# Patient Record
Sex: Female | Born: 1955 | Race: Asian | Hispanic: No | Marital: Married | Smoking: Never smoker
Health system: Southern US, Community
[De-identification: ages and names within clinical notes are randomized; demographics above are authoritative.]

## PROBLEM LIST (undated history)

## (undated) DIAGNOSIS — I10 Essential (primary) hypertension: Secondary | ICD-10-CM

## (undated) DIAGNOSIS — E785 Hyperlipidemia, unspecified: Secondary | ICD-10-CM

## (undated) DIAGNOSIS — K649 Unspecified hemorrhoids: Secondary | ICD-10-CM

## (undated) HISTORY — DX: Hyperlipidemia, unspecified: E78.5

## (undated) HISTORY — DX: Essential (primary) hypertension: I10

## (undated) HISTORY — DX: Unspecified hemorrhoids: K64.9

## (undated) HISTORY — PX: NO PAST SURGERIES: SHX2092

---

## 1998-05-08 ENCOUNTER — Encounter: Admission: RE | Admit: 1998-05-08 | Discharge: 1998-05-08 | Payer: Self-pay | Admitting: Internal Medicine

## 1998-05-20 ENCOUNTER — Other Ambulatory Visit: Admission: RE | Admit: 1998-05-20 | Discharge: 1998-05-20 | Payer: Self-pay | Admitting: *Deleted

## 1998-05-20 ENCOUNTER — Encounter: Admission: RE | Admit: 1998-05-20 | Discharge: 1998-05-20 | Payer: Self-pay | Admitting: Hematology and Oncology

## 1998-06-20 ENCOUNTER — Encounter: Admission: RE | Admit: 1998-06-20 | Discharge: 1998-06-20 | Payer: Self-pay | Admitting: Internal Medicine

## 1998-09-23 ENCOUNTER — Ambulatory Visit (HOSPITAL_COMMUNITY): Admission: RE | Admit: 1998-09-23 | Discharge: 1998-09-23 | Payer: Self-pay | Admitting: Internal Medicine

## 2001-03-15 ENCOUNTER — Encounter: Admission: RE | Admit: 2001-03-15 | Discharge: 2001-03-15 | Payer: Self-pay | Admitting: Internal Medicine

## 2001-03-15 ENCOUNTER — Ambulatory Visit (HOSPITAL_COMMUNITY): Admission: RE | Admit: 2001-03-15 | Discharge: 2001-03-15 | Payer: Self-pay | Admitting: Internal Medicine

## 2001-04-01 ENCOUNTER — Encounter: Admission: RE | Admit: 2001-04-01 | Discharge: 2001-04-01 | Payer: Self-pay | Admitting: Internal Medicine

## 2001-04-20 ENCOUNTER — Ambulatory Visit (HOSPITAL_COMMUNITY): Admission: RE | Admit: 2001-04-20 | Discharge: 2001-04-20 | Payer: Self-pay | Admitting: Internal Medicine

## 2001-06-27 ENCOUNTER — Encounter: Admission: RE | Admit: 2001-06-27 | Discharge: 2001-06-27 | Payer: Self-pay | Admitting: Internal Medicine

## 2002-04-21 ENCOUNTER — Ambulatory Visit (HOSPITAL_COMMUNITY): Admission: RE | Admit: 2002-04-21 | Discharge: 2002-04-21 | Payer: Self-pay | Admitting: Internal Medicine

## 2002-04-21 ENCOUNTER — Encounter: Payer: Self-pay | Admitting: Internal Medicine

## 2004-12-29 ENCOUNTER — Inpatient Hospital Stay (HOSPITAL_COMMUNITY): Admission: EM | Admit: 2004-12-29 | Discharge: 2004-12-30 | Payer: Self-pay | Admitting: Emergency Medicine

## 2004-12-29 ENCOUNTER — Ambulatory Visit: Payer: Self-pay | Admitting: Internal Medicine

## 2005-01-29 ENCOUNTER — Ambulatory Visit: Payer: Self-pay | Admitting: Internal Medicine

## 2005-04-24 ENCOUNTER — Ambulatory Visit: Payer: Self-pay | Admitting: Internal Medicine

## 2005-09-04 ENCOUNTER — Ambulatory Visit: Payer: Self-pay | Admitting: Internal Medicine

## 2005-09-08 ENCOUNTER — Ambulatory Visit (HOSPITAL_COMMUNITY): Admission: RE | Admit: 2005-09-08 | Discharge: 2005-09-08 | Payer: Self-pay | Admitting: Internal Medicine

## 2005-09-11 ENCOUNTER — Ambulatory Visit: Payer: Self-pay | Admitting: Hospitalist

## 2005-10-08 ENCOUNTER — Ambulatory Visit: Payer: Self-pay | Admitting: Internal Medicine

## 2006-07-28 ENCOUNTER — Ambulatory Visit: Payer: Self-pay | Admitting: Internal Medicine

## 2006-07-28 ENCOUNTER — Encounter (INDEPENDENT_AMBULATORY_CARE_PROVIDER_SITE_OTHER): Payer: Self-pay | Admitting: Internal Medicine

## 2006-07-28 LAB — CONVERTED CEMR LAB
Bilirubin Urine: NEGATIVE
Hemoglobin, Urine: NEGATIVE
Ketones, ur: NEGATIVE mg/dL
Protein, ur: NEGATIVE mg/dL
Specific Gravity, Urine: 1.026 (ref 1.005–1.03)
Urine Glucose: NEGATIVE mg/dL
pH: 7.5 (ref 5.0–8.0)

## 2006-07-29 ENCOUNTER — Encounter (INDEPENDENT_AMBULATORY_CARE_PROVIDER_SITE_OTHER): Payer: Self-pay | Admitting: Internal Medicine

## 2006-08-04 ENCOUNTER — Ambulatory Visit: Payer: Self-pay | Admitting: Internal Medicine

## 2006-08-19 DIAGNOSIS — N390 Urinary tract infection, site not specified: Secondary | ICD-10-CM

## 2006-08-19 DIAGNOSIS — K219 Gastro-esophageal reflux disease without esophagitis: Secondary | ICD-10-CM

## 2006-08-19 DIAGNOSIS — K649 Unspecified hemorrhoids: Secondary | ICD-10-CM | POA: Insufficient documentation

## 2006-08-20 ENCOUNTER — Ambulatory Visit: Payer: Self-pay | Admitting: Internal Medicine

## 2006-08-20 ENCOUNTER — Encounter (INDEPENDENT_AMBULATORY_CARE_PROVIDER_SITE_OTHER): Payer: Self-pay | Admitting: Internal Medicine

## 2006-08-20 LAB — CONVERTED CEMR LAB
AST: 18 units/L (ref 0–37)
Albumin: 4.3 g/dL (ref 3.5–5.2)
Alkaline Phosphatase: 81 units/L (ref 39–117)
BUN: 20 mg/dL (ref 6–23)
Basophils Absolute: 0 10*3/uL (ref 0.0–0.1)
Basophils Relative: 1 % (ref 0–1)
CO2: 25 meq/L (ref 19–32)
Calcium: 9.2 mg/dL (ref 8.4–10.5)
Creatinine, Ser: 0.74 mg/dL (ref 0.40–1.20)
GC Probe Amp, Genital: NEGATIVE
Glucose, Bld: 86 mg/dL (ref 70–99)
HCT: 37.5 % (ref 34.4–43.3)
Hemoglobin, Urine: NEGATIVE
Hemoglobin: 12.3 g/dL (ref 11.7–14.8)
Leukocytes, UA: NEGATIVE
Lymphocytes Relative: 43 % (ref 15–43)
MCHC: 32.8 g/dL — ABNORMAL LOW (ref 33.1–35.4)
MCV: 79.8 fL (ref 78.8–100.0)
Neutrophils Relative %: 49 % (ref 47–77)
Pap Smear: NORMAL
Platelets: 257 10*3/uL (ref 152–374)
Potassium: 4.2 meq/L (ref 3.5–5.3)
Specific Gravity, Urine: 1.025 (ref 1.005–1.03)
Urine Glucose: NEGATIVE mg/dL
Urobilinogen, UA: 1 (ref 0.0–1.0)
pH: 6 (ref 5.0–8.0)

## 2006-08-24 ENCOUNTER — Ambulatory Visit (HOSPITAL_COMMUNITY): Admission: RE | Admit: 2006-08-24 | Discharge: 2006-08-24 | Payer: Self-pay | Admitting: Internal Medicine

## 2006-09-10 ENCOUNTER — Encounter (INDEPENDENT_AMBULATORY_CARE_PROVIDER_SITE_OTHER): Payer: Self-pay | Admitting: Internal Medicine

## 2006-09-10 DIAGNOSIS — M545 Low back pain: Secondary | ICD-10-CM

## 2007-05-04 IMAGING — US US TRANSVAGINAL NON-OB
1 series · 14 of 25 positions shown · non-contrast
Comparison: none

CLINICAL DATA: Right pelvic pain.  
 TRANSABDOMINAL AND TRANSVAGINAL PELVIC ULTRASOUND:
TECHNIQUE: Both transabdominal and transvaginal ultrasound examinations of the pelvis were performed including evaluation of the uterus, ovaries, adnexal regions, and pelvic cul-de-sac.

[Series 1: unknown · 0.28mm/px · 14 of 62 slices shown]
[im 1/62]
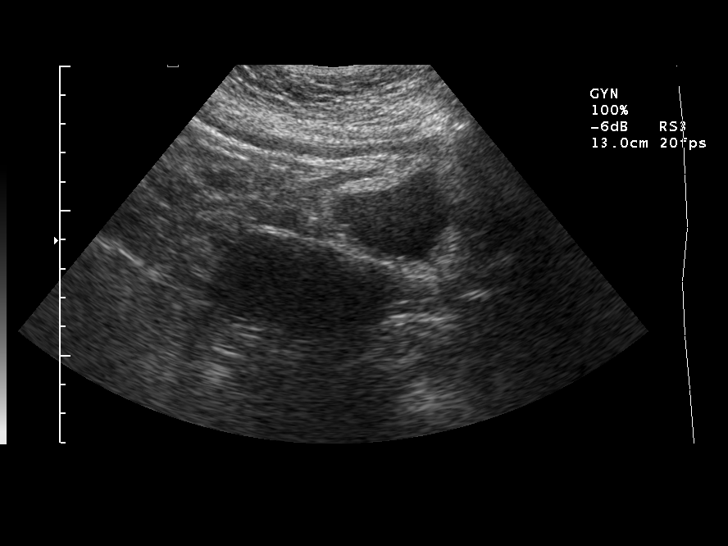
[im 6/62]
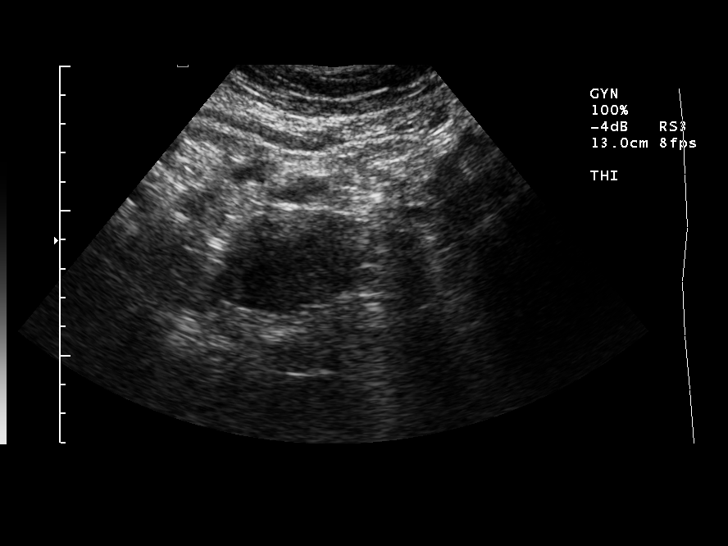
[im 11/62]
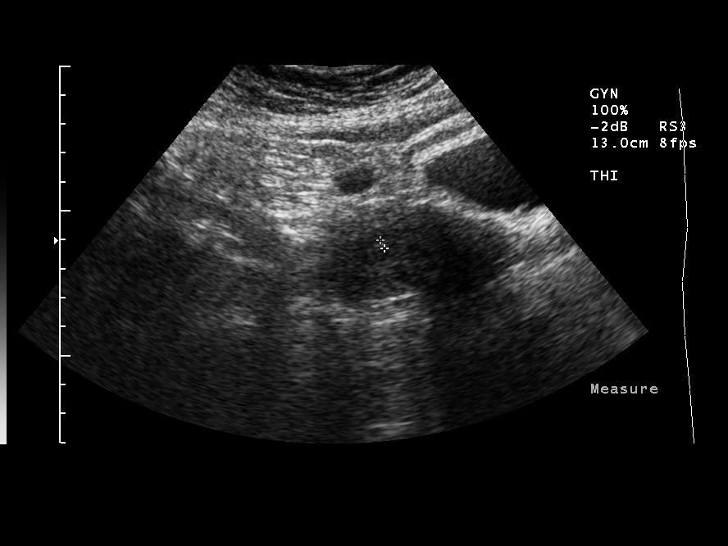
[im 16/62]
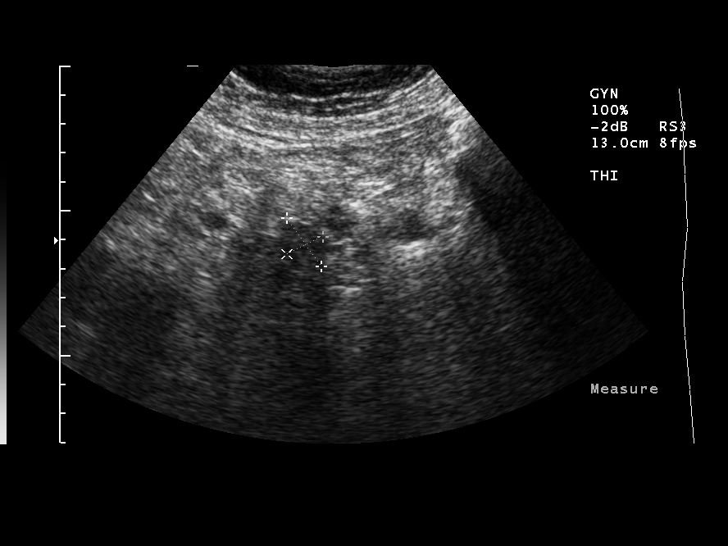
[im 21/62]
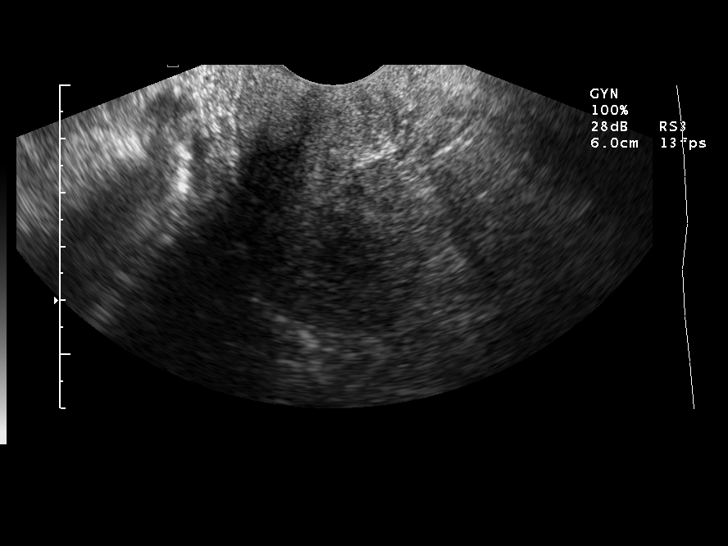
[im 23/62]
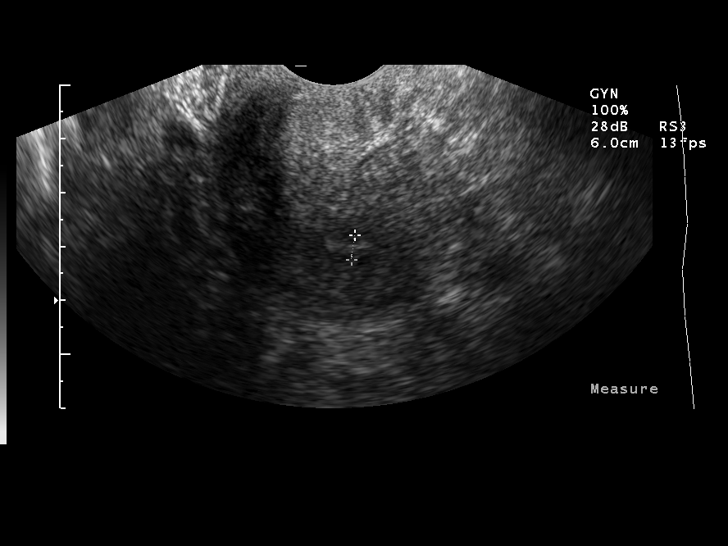
[im 28/62]
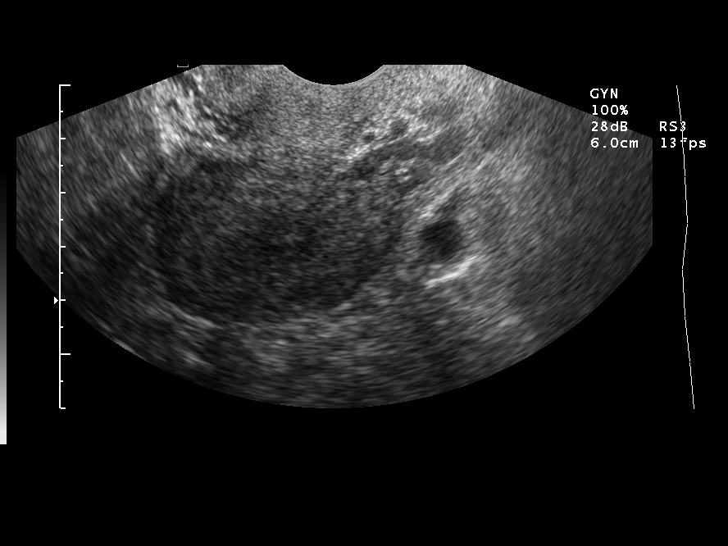
[im 34/62]
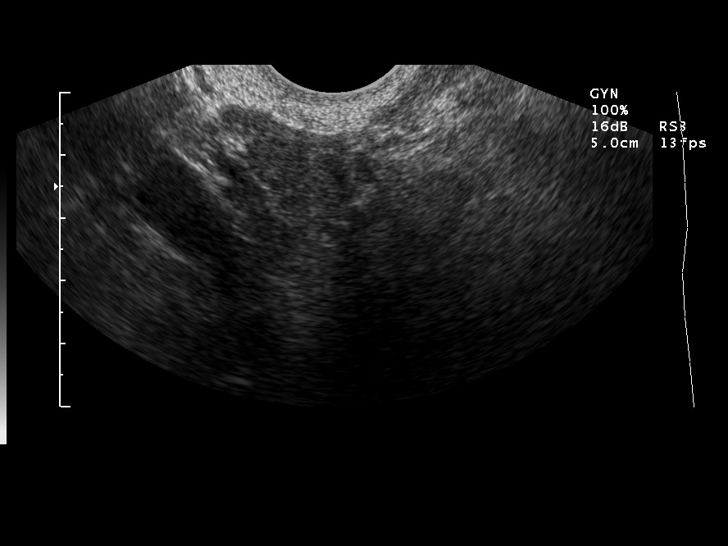
[im 39/62]
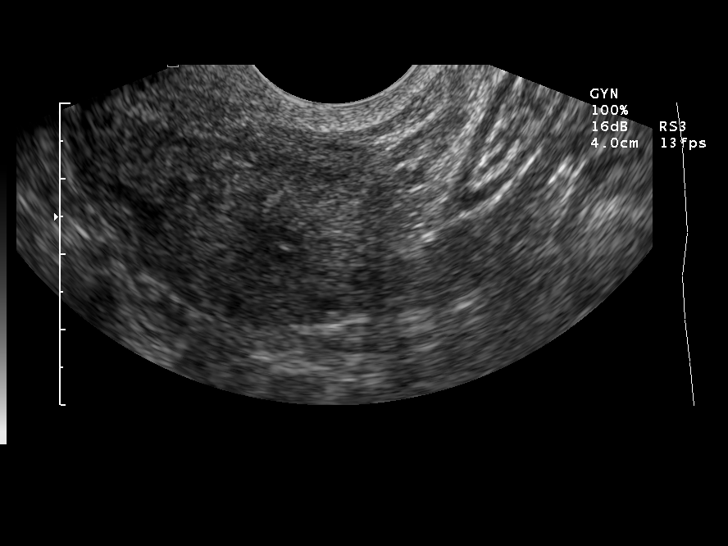
[im 41/62]
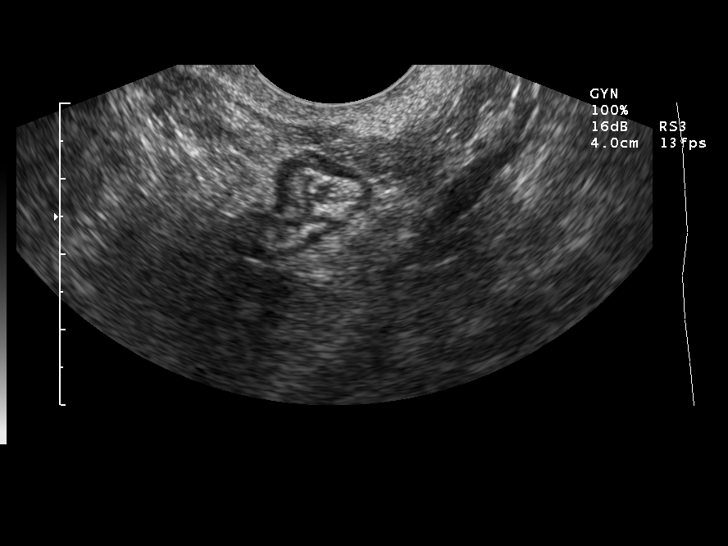
[im 46/62]
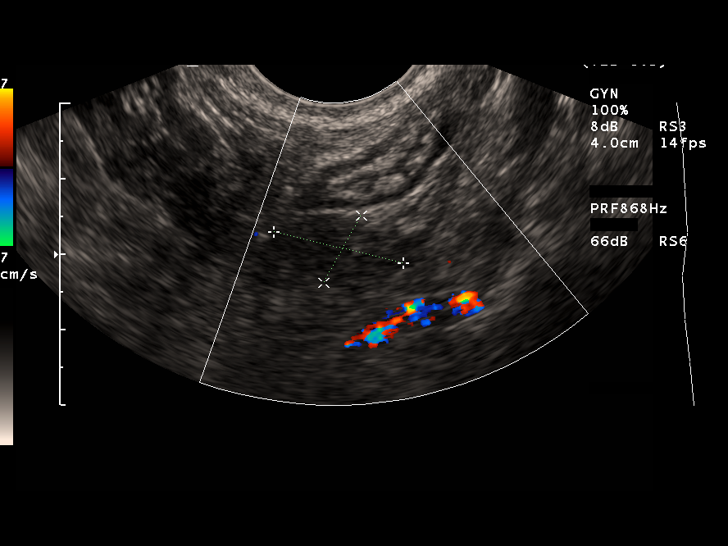
[im 51/62]
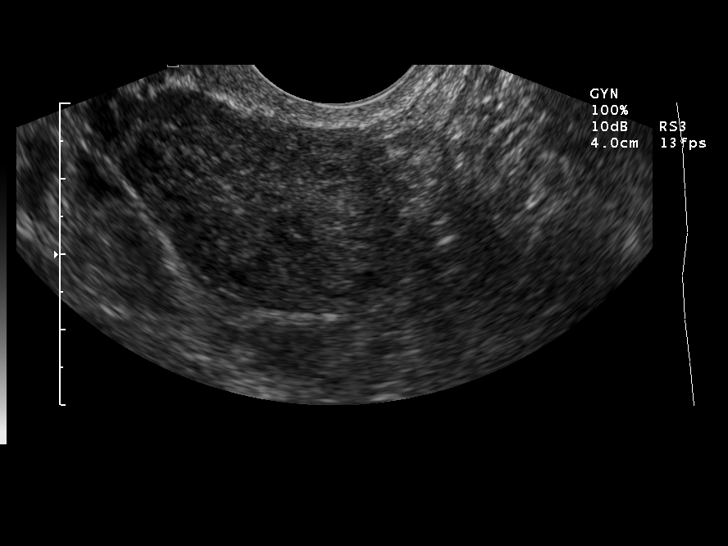
[im 56/62]
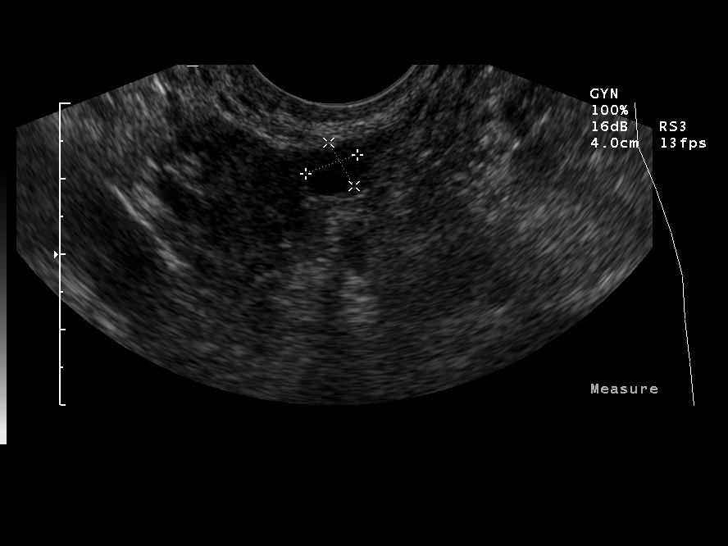
[im 62/62]
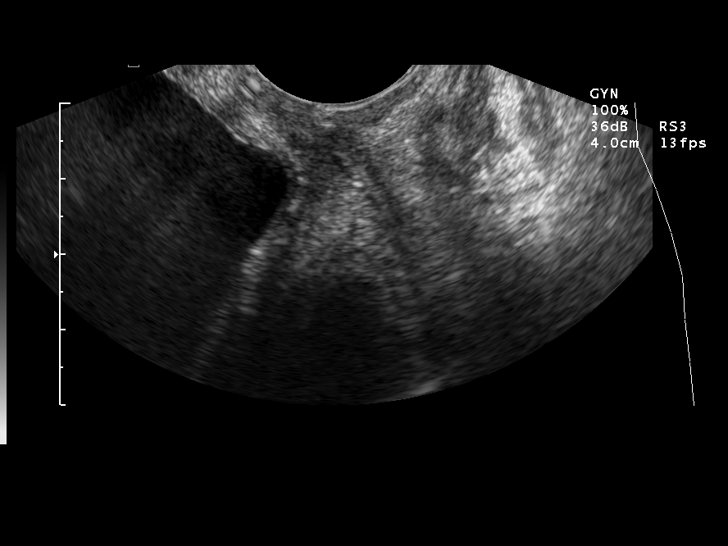

[14 of 25 positions shown; findings below may reference images not displayed]

FINDINGS: The uterus is retroverted but is normal in size measuring approximately 6 cm in length.  No fibroids or other uterine masses are seen.  Endometrium is normal in appearance and measures 2 to 3 mm in thickness.  
 Both ovaries are normal in appearance.  No adnexal masses are identified by either transabdominal or transvaginal sonography and there is no evidence of free fluid.
IMPRESSION: 1.  Normal retroverted uterus. 
 2.  Normal ovaries.  No acute findings.

## 2014-03-30 ENCOUNTER — Encounter (HOSPITAL_COMMUNITY): Payer: Self-pay | Admitting: Emergency Medicine

## 2014-03-30 ENCOUNTER — Emergency Department (HOSPITAL_COMMUNITY)
Admission: EM | Admit: 2014-03-30 | Discharge: 2014-03-30 | Disposition: A | Discharge status: Home / Self Care | Payer: Self-pay | Attending: Emergency Medicine | Admitting: Emergency Medicine

## 2014-03-30 ENCOUNTER — Emergency Department (HOSPITAL_COMMUNITY): Payer: Self-pay

## 2014-03-30 DIAGNOSIS — R296 Repeated falls: Secondary | ICD-10-CM | POA: Insufficient documentation

## 2014-03-30 DIAGNOSIS — T148XXA Other injury of unspecified body region, initial encounter: Secondary | ICD-10-CM

## 2014-03-30 DIAGNOSIS — Y939 Activity, unspecified: Secondary | ICD-10-CM | POA: Insufficient documentation

## 2014-03-30 DIAGNOSIS — S79919A Unspecified injury of unspecified hip, initial encounter: Secondary | ICD-10-CM | POA: Insufficient documentation

## 2014-03-30 DIAGNOSIS — Y929 Unspecified place or not applicable: Secondary | ICD-10-CM | POA: Insufficient documentation

## 2014-03-30 DIAGNOSIS — Z8719 Personal history of other diseases of the digestive system: Secondary | ICD-10-CM | POA: Insufficient documentation

## 2014-03-30 DIAGNOSIS — S298XXA Other specified injuries of thorax, initial encounter: Secondary | ICD-10-CM | POA: Insufficient documentation

## 2014-03-30 DIAGNOSIS — R0602 Shortness of breath: Secondary | ICD-10-CM | POA: Insufficient documentation

## 2014-03-30 DIAGNOSIS — W19XXXA Unspecified fall, initial encounter: Secondary | ICD-10-CM

## 2014-03-30 DIAGNOSIS — S8000XA Contusion of unspecified knee, initial encounter: Secondary | ICD-10-CM | POA: Insufficient documentation

## 2014-03-30 DIAGNOSIS — S99929A Unspecified injury of unspecified foot, initial encounter: Secondary | ICD-10-CM

## 2014-03-30 DIAGNOSIS — S99919A Unspecified injury of unspecified ankle, initial encounter: Secondary | ICD-10-CM

## 2014-03-30 DIAGNOSIS — S8990XA Unspecified injury of unspecified lower leg, initial encounter: Secondary | ICD-10-CM | POA: Insufficient documentation

## 2014-03-30 DIAGNOSIS — S8010XA Contusion of unspecified lower leg, initial encounter: Secondary | ICD-10-CM | POA: Insufficient documentation

## 2014-03-30 DIAGNOSIS — IMO0002 Reserved for concepts with insufficient information to code with codable children: Secondary | ICD-10-CM | POA: Insufficient documentation

## 2014-03-30 DIAGNOSIS — S79929A Unspecified injury of unspecified thigh, initial encounter: Secondary | ICD-10-CM

## 2014-03-30 MED ORDER — HYDROCODONE-ACETAMINOPHEN 5-325 MG PO TABS: 1.0000 | ORAL_TABLET | Freq: Four times a day (QID) | ORAL | Status: DC | PRN

## 2014-03-30 MED ORDER — HYDROCODONE-ACETAMINOPHEN 5-325 MG PO TABS
1.0000 | ORAL_TABLET | Freq: Once | ORAL | Status: AC
  Administered 2014-03-30: 1 via ORAL
  Filled 2014-03-30: qty 1
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
## 2014-03-30 NOTE — ED Notes (Signed)
Pt presents with pain to her Left knee, Right foot, lower back due to a [place] [employer] [place] 1330 today. Pt states she slipped on some water and fell into a split. NAD< pt alert and oriented x4

## 2014-03-30 NOTE — ED Provider Notes (Signed)
Medical screening examination/treatment/procedure(s) were performed by non-physician practitioner and as supervising physician I was immediately available for consultation/collaboration.     Hasina Kreager E Rocky Gladden, MD 03/30/14 2350 

## 2014-03-30 NOTE — Discharge Instructions (Signed)
Contusion °A contusion is the result of an injury to the skin and underlying tissues and is usually caused by direct trauma. The injury results in the appearance of a bruise on the skin overlying the injured tissues. Contusions cause rupture and bleeding of the small capillaries and blood vessels and affect function, because the bleeding infiltrates muscles, tendons, nerves, or other soft tissues.  °SYMPTOMS  °· Swelling and often a hard lump in the injured area, either superficial or deep. °· Pain and tenderness over the area of the contusion. °· Feeling of firmness when pressure is exerted over the contusion. °· Discoloration under the skin, beginning with redness and progressing to the characteristic "black and blue" bruise. °CAUSES  °A contusion is typically the result of direct trauma. This is often by a blunt object.  °RISK INCREASES WITH: °· Sports that have a high likelihood of trauma (football, boxing, ice hockey, soccer, field hockey, martial arts, basketball, and baseball). °· Sports that make falling from a height likely (high-jumping, pole-vaulting, skating, or gymnastics). °· Any bleeding disorder (hemophilia) or taking medications that affect clotting (aspirin, nonsteroidal anti-inflammatory medications, or warfarin [Coumadin]). °· Inadequate protection of exposed areas during contact sports. °PREVENTION °· Maintain physical fitness: °¨ Joint and muscle flexibility. °¨ Strength and endurance. °¨ Coordination. °· Wear proper protective equipment. Make sure it fits correctly. °PROGNOSIS  °Contusions typically heal without any complications. Healing time varies with the severity of injury and intake of medications that affect clotting. Contusions usually heal in 1 to 4 weeks. °RELATED COMPLICATIONS  °· Damage to nearby nerves or blood vessels, causing numbness, coldness, or paleness. °· Compartment syndrome. °· Bleeding into the soft tissues that leads to disability. °· Infiltrative-type bleeding,  leading to the calcification and impaired function of the injured muscle (rare). °· Prolonged healing time if usual activities are resumed too soon. °· Infection if the skin over the injury site is broken. °· Fracture of the bone underlying the contusion. °· Stiffness in the joint where the injured muscle crosses. °TREATMENT  °Treatment initially consists of resting the injured area as well as medication and ice to reduce inflammation. The use of a compression bandage may also be helpful in minimizing inflammation. As pain diminishes and movement is tolerated, the joint where the affected muscle crosses should be moved to prevent stiffness and the shortening (contracture) of the joint. Movement of the joint should begin as soon as possible. It is also important to work on maintaining strength within the affected muscles. °Occasionally, extra padding over the area of contusion may be recommended before returning to sports, particularly if re-injury is likely.  °MEDICATION  °· If pain relief is necessary these medications are often recommended: °¨ Nonsteroidal anti-inflammatory medications, such as aspirin and ibuprofen. °¨ Other minor pain relievers, such as acetaminophen, are often recommended. °· Prescription pain relievers may be given by your caregiver. Use only as directed and only as much as you need. °HEAT AND COLD °· Cold treatment (icing) relieves pain and reduces inflammation. Cold treatment should be applied for 10 to 15 minutes every 2 to 3 hours for inflammation and pain and immediately after any activity that aggravates your symptoms. Use ice packs or an ice massage. (To do an ice massage fill a large styrofoam cup with water and freeze. Tear a small amount of foam from the top so ice protrudes. Massage ice firmly over the injured area in a circle about the size of a softball.) °· Heat treatment may be used prior to   performing the stretching and strengthening activities prescribed by your caregiver,  physical therapist, or athletic trainer. Use a heat pack or a warm soak. °SEEK MEDICAL CARE IF:  °· Symptoms get worse or do not improve despite treatment in a few days. °· You have difficulty moving a joint. °· Any extremity becomes extremely painful, numb, pale, or cool (This is an emergency!). °· Medication produces any side effects (bleeding, upset stomach, or allergic reaction). °· Signs of infection (drainage from skin, headache, muscle aches, dizziness, fever, or general ill feeling) occur if skin was broken. °Document Released: 08/24/2005 Document Revised: 11/16/2011 Document Reviewed: 12/06/2008 °Exit[employer]® Patient Information ©2015 [employer]. This information is not intended to replace advice given to you by your health care provider. Make sure you discuss any questions you have with your health care provider. ° °

## 2014-05-23 ENCOUNTER — Other Ambulatory Visit (HOSPITAL_COMMUNITY): Payer: Self-pay | Admitting: Orthopaedic Surgery

## 2014-05-23 DIAGNOSIS — M545 Low back pain, unspecified: Secondary | ICD-10-CM

## 2014-06-02 ENCOUNTER — Ambulatory Visit (HOSPITAL_COMMUNITY)
Admission: RE | Admit: 2014-06-02 | Discharge: 2014-06-02 | Disposition: A | Discharge status: Home / Self Care | Payer: Self-pay | Source: Ambulatory Visit | Attending: Orthopaedic Surgery | Admitting: Orthopaedic Surgery

## 2014-07-27 ENCOUNTER — Other Ambulatory Visit (HOSPITAL_COMMUNITY): Payer: Self-pay | Admitting: Orthopaedic Surgery

## 2014-07-27 DIAGNOSIS — M5440 Lumbago with sciatica, unspecified side: Secondary | ICD-10-CM

## 2014-08-13 ENCOUNTER — Ambulatory Visit (HOSPITAL_COMMUNITY): Payer: Self-pay

## 2014-09-04 ENCOUNTER — Ambulatory Visit (HOSPITAL_COMMUNITY)
Admission: RE | Admit: 2014-09-04 | Discharge: 2014-09-04 | Disposition: A | Discharge status: Home / Self Care | Payer: Self-pay | Source: Ambulatory Visit | Attending: Orthopaedic Surgery | Admitting: Orthopaedic Surgery

## 2014-09-04 DIAGNOSIS — R200 Anesthesia of skin: Secondary | ICD-10-CM | POA: Insufficient documentation

## 2014-09-04 DIAGNOSIS — R531 Weakness: Secondary | ICD-10-CM | POA: Insufficient documentation

## 2014-09-04 DIAGNOSIS — M5440 Lumbago with sciatica, unspecified side: Secondary | ICD-10-CM

## 2014-09-04 DIAGNOSIS — M5126 Other intervertebral disc displacement, lumbar region: Secondary | ICD-10-CM | POA: Insufficient documentation

## 2014-12-10 ENCOUNTER — Encounter (HOSPITAL_COMMUNITY): Payer: Self-pay

## 2014-12-10 ENCOUNTER — Emergency Department (INDEPENDENT_AMBULATORY_CARE_PROVIDER_SITE_OTHER)
Admission: EM | Admit: 2014-12-10 | Discharge: 2014-12-10 | Disposition: A | Discharge status: Home / Self Care | Payer: Self-pay | Source: Home / Self Care | Attending: Family Medicine | Admitting: Family Medicine

## 2014-12-10 DIAGNOSIS — R8290 Unspecified abnormal findings in urine: Secondary | ICD-10-CM

## 2014-12-10 DIAGNOSIS — H9201 Otalgia, right ear: Secondary | ICD-10-CM

## 2014-12-10 LAB — POCT I-STAT, CHEM 8
BUN: 15 mg/dL (ref 6–23)
Calcium, Ion: 1.2 mmol/L (ref 1.12–1.23)
Chloride: 103 mmol/L (ref 96–112)
Creatinine, Ser: 0.6 mg/dL (ref 0.50–1.10)
GLUCOSE: 106 mg/dL — AB (ref 70–99)
HEMATOCRIT: 48 % — AB (ref 36.0–46.0)
Hemoglobin: 16.3 g/dL — ABNORMAL HIGH (ref 12.0–15.0)
POTASSIUM: 4 mmol/L (ref 3.5–5.1)
SODIUM: 141 mmol/L (ref 135–145)
TCO2: 25 mmol/L (ref 0–100)

## 2014-12-10 LAB — POCT URINALYSIS DIP (DEVICE)
BILIRUBIN URINE: NEGATIVE
GLUCOSE, UA: NEGATIVE mg/dL
Hgb urine dipstick: NEGATIVE
Ketones, ur: NEGATIVE mg/dL
Leukocytes, UA: NEGATIVE
Nitrite: NEGATIVE
Protein, ur: NEGATIVE mg/dL
SPECIFIC GRAVITY, URINE: 1.02 (ref 1.005–1.030)
UROBILINOGEN UA: 0.2 mg/dL (ref 0.0–1.0)
pH: 7 (ref 5.0–8.0)

## 2014-12-10 NOTE — Discharge Instructions (Signed)
Thank you for coming in today. I think your ear hurts because of allergies.  Use Over the counter zyrtec and Rhinocort Follow up with ear nose and throat doctors.  [facility] [address] [place], [place] [address] [phone]  Please call or see Ms Antionette CharMaggy Mena for assistance with your bill.  You may qualify for reduced or free services.  Her phone number is [phone]. Her email is [email or web address]  You do not have a UTI.   Otalgia The most common reason for this in children is an infection of the middle ear. Pain from the middle ear is usually caused by a build-up of fluid and pressure behind the eardrum. Pain from an earache can be sharp, dull, or burning. The pain may be temporary or constant. The middle ear is connected to the nasal passages by a short narrow tube called the Eustachian tube. The Eustachian tube allows fluid to drain out of the middle ear, and helps keep the pressure in your ear equalized. CAUSES  A cold or allergy can block the Eustachian tube with inflammation and the build-up of secretions. This is especially likely in small children, because their Eustachian tube is shorter and more horizontal. When the Eustachian tube closes, the normal flow of fluid from the middle ear is stopped. Fluid can accumulate and cause stuffiness, pain, hearing loss, and an ear infection if germs start growing in this area. SYMPTOMS  The symptoms of an ear infection may include fever, ear pain, fussiness, increased crying, and irritability. Many children will have temporary and minor hearing loss during and right after an ear infection. Permanent hearing loss is rare, but the risk increases the more infections a child has. Other causes of ear pain include retained water in the outer ear canal from swimming and bathing. Ear pain in adults is less likely to be from an ear infection. Ear pain may be referred from other locations.  Referred pain may be from the joint between your jaw and the skull. It may also come from a tooth problem or problems in the neck. Other causes of ear pain include:  A foreign body in the ear.  Outer ear infection.  Sinus infections.  Impacted ear wax.  Ear injury.  Arthritis of the jaw or TMJ problems.  Middle ear infection.  Tooth infections.  Sore throat with pain to the ears. DIAGNOSIS  Your caregiver can usually make the diagnosis by examining you. Sometimes other special studies, including x-rays and lab work may be necessary. TREATMENT   If antibiotics were prescribed, use them as directed and finish them even if you or your child's symptoms seem to be improved.  Sometimes PE tubes are needed in children. These are little plastic tubes which are put into the eardrum during a simple surgical procedure. They allow fluid to drain easier and allow the pressure in the middle ear to equalize. This helps relieve the ear pain caused by pressure changes. HOME CARE INSTRUCTIONS   Only take over-the-counter or prescription medicines for pain, discomfort, or fever as directed by your caregiver. DO NOT GIVE CHILDREN ASPIRIN because of the association of Reye's Syndrome in children taking aspirin.  Use a cold pack applied to the outer ear for 15-20 minutes, 03-04 times per day or as needed may reduce pain. Do not apply ice directly to the skin. You may cause frost bite.  Over-the-counter ear drops used as directed may be effective. Your caregiver may sometimes prescribe ear drops.  Resting in an upright position may help reduce pressure in the middle ear and relieve pain.  Ear pain caused by rapidly descending from high altitudes can be relieved by swallowing or chewing gum. Allowing infants to suck on a bottle during airplane travel can help.  Do not smoke in the house or near children. If you are unable to quit smoking, smoke outside.  Control allergies. SEEK IMMEDIATE MEDICAL  CARE IF:   You or your child are becoming sicker.  Pain or fever relief is not obtained with medicine.  You or your child's symptoms (pain, fever, or irritability) do not improve within 24 to 48 hours or as instructed.  Severe pain suddenly stops hurting. This may indicate a ruptured eardrum.  You or your children develop new problems such as severe headaches, stiff neck, difficulty swallowing, or swelling of the face or around the ear. Document Released: 04/10/2004 Document Revised: 11/16/2011 Document Reviewed: 08/15/2008 [facility] Patient Information 2015 [place], [place]. This information is not intended to replace advice given to you by your health care provider. Make sure you discuss any questions you have with your health care provider.

## 2014-12-10 NOTE — ED Notes (Signed)
Here w family. Has been trying to see [facility] x 1 month w/o success. C/o sore ear, pain, blood, odor in urine. NAD

## 2014-12-10 NOTE — ED Provider Notes (Signed)
Jeanne Vasquez is a 59 y.o. female who presents to Urgent Care today for  1) odor in urine present for 3 weeks. No significant urinary frequency urgency or dysuria. She feels well otherwise with no fevers or chills vomiting or diarrhea.  2) right-sided ear pain present for 3 weeks. She notes pain ringing in her ear and change in hearing. She has not tried any treatment.   History reviewed. No pertinent past medical history. History reviewed. No pertinent past surgical history. History  Substance Use Topics  . Smoking status: Never Smoker   . Smokeless tobacco: Not on file  . Alcohol Use: No   ROS as above Medications: No current facility-administered medications for this encounter.   Current Outpatient Prescriptions  Medication Sig Dispense Refill  . HYDROcodone-acetaminophen (NORCO/VICODIN) 5-325 MG per tablet Take 1 tablet by mouth every 6 (six) hours as needed for moderate pain or severe pain. 20 tablet 0   Allergies  Allergen Reactions  . Crab [Shellfish Allergy]     Rash      Exam:  BP 160/105 mmHg  Pulse 87  Temp(Src) 98.2 F (36.8 C) (Oral)  Resp 16  SpO2 97% Gen: Well NAD HEENT: EOMI,  MMM tympanic membranes are normal appearing bilaterally. Posterior pharynx is normal appearing Lungs: Normal work of breathing. CTABL Heart: RRR no MRG Abd: NABS, Soft. Nondistended, Nontender no CV angle tenderness to percussion Exts: Brisk capillary refill, warm and well perfused.   Results for orders placed or performed during the hospital encounter of 12/10/14 (from the past 24 hour(s))  POCT urinalysis dip (device)     Status: None   Collection Time: 12/10/14  4:29 PM  Result Value Ref Range   Glucose, UA NEGATIVE NEGATIVE mg/dL   Bilirubin Urine NEGATIVE NEGATIVE   Ketones, ur NEGATIVE NEGATIVE mg/dL   Specific Gravity, Urine 1.020 1.005 - 1.030   Hgb urine dipstick NEGATIVE NEGATIVE   pH 7.0 5.0 - 8.0   Protein, ur NEGATIVE NEGATIVE mg/dL   Urobilinogen, UA 0.2 0.0 - 1.0  mg/dL   Nitrite NEGATIVE NEGATIVE   Leukocytes, UA NEGATIVE NEGATIVE  I-STAT, chem 8     Status: Abnormal   Collection Time: 12/10/14  5:24 PM  Result Value Ref Range   Sodium 141 135 - 145 mmol/L   Potassium 4.0 3.5 - 5.1 mmol/L   Chloride 103 96 - 112 mmol/L   BUN 15 6 - 23 mg/dL   Creatinine, Ser 6.570.60 0.50 - 1.10 mg/dL   Glucose, Bld 846106 (H) 70 - 99 mg/dL   Calcium, Ion 9.621.20 1.12 - 1.23 mmol/L   TCO2 25 0 - 100 mmol/L   Hemoglobin 16.3 (H) 12.0 - 15.0 g/dL   HCT 95.248.0 (H) 84.136.0 - 32.446.0 %   No results found.  Assessment and Plan: 59 y.o. female with  1) urinary odor. Unclear etiology. Urine culture pending. Follow-up with PCP 2) ear pain with change in hearing. Treat with Zyrtec and Rhinocort. Follow up with [facility] 3) no primary care provider. [facility].  Discussed warning signs or symptoms. Please see discharge instructions. Patient expresses understanding.     Rodolph BongEvan S Yash Cacciola, MD 12/10/14 [phone]

## 2014-12-11 LAB — URINE CULTURE
COLONY COUNT: NO GROWTH
CULTURE: NO GROWTH
Special Requests: NORMAL

## 2015-02-11 ENCOUNTER — Telehealth: Payer: Self-pay | Admitting: Pulmonary Disease

## 2015-02-11 NOTE — Telephone Encounter (Signed)
Call to patient to confirm appointment for 02/12/15 at 8:15 phone is disconnected

## 2015-02-12 ENCOUNTER — Encounter: Payer: Self-pay | Admitting: Pulmonary Disease

## 2015-02-12 ENCOUNTER — Ambulatory Visit (INDEPENDENT_AMBULATORY_CARE_PROVIDER_SITE_OTHER): Payer: Self-pay | Admitting: Pulmonary Disease

## 2015-02-12 VITALS — BP 150/94 | HR 66 | Temp 98.2°F | Ht 59.0 in | Wt 164.2 lb

## 2015-02-12 DIAGNOSIS — N898 Other specified noninflammatory disorders of vagina: Secondary | ICD-10-CM

## 2015-02-12 DIAGNOSIS — E785 Hyperlipidemia, unspecified: Secondary | ICD-10-CM

## 2015-02-12 DIAGNOSIS — R030 Elevated blood-pressure reading, without diagnosis of hypertension: Secondary | ICD-10-CM

## 2015-02-12 DIAGNOSIS — IMO0001 Reserved for inherently not codable concepts without codable children: Secondary | ICD-10-CM

## 2015-02-12 DIAGNOSIS — Z1231 Encounter for screening mammogram for malignant neoplasm of breast: Secondary | ICD-10-CM | POA: Insufficient documentation

## 2015-02-12 DIAGNOSIS — Z0000 Encounter for general adult medical examination without abnormal findings: Secondary | ICD-10-CM

## 2015-02-12 DIAGNOSIS — B373 Candidiasis of vulva and vagina: Secondary | ICD-10-CM

## 2015-02-12 DIAGNOSIS — B3731 Acute candidiasis of vulva and vagina: Secondary | ICD-10-CM | POA: Insufficient documentation

## 2015-02-12 LAB — CBC WITH DIFFERENTIAL/PLATELET
BASOS ABS: 0 10*3/uL (ref 0.0–0.1)
Basophils Relative: 1 % (ref 0–1)
EOS ABS: 0.1 10*3/uL (ref 0.0–0.7)
EOS PCT: 2 % (ref 0–5)
HEMATOCRIT: 39.1 % (ref 36.0–46.0)
Hemoglobin: 12.8 g/dL (ref 12.0–15.0)
LYMPHS ABS: 1.8 10*3/uL (ref 0.7–4.0)
LYMPHS PCT: 42 % (ref 12–46)
MCH: 25.4 pg — ABNORMAL LOW (ref 26.0–34.0)
MCHC: 32.7 g/dL (ref 30.0–36.0)
MCV: 77.6 fL — AB (ref 78.0–100.0)
MONO ABS: 0.4 10*3/uL (ref 0.1–1.0)
MPV: 10.2 fL (ref 8.6–12.4)
Monocytes Relative: 9 % (ref 3–12)
NEUTROS PCT: 46 % (ref 43–77)
Neutro Abs: 2 10*3/uL (ref 1.7–7.7)
PLATELETS: 230 10*3/uL (ref 150–400)
RBC: 5.04 MIL/uL (ref 3.87–5.11)
RDW: 15.2 % (ref 11.5–15.5)
WBC: 4.4 10*3/uL (ref 4.0–10.5)

## 2015-02-12 LAB — LIPID PANEL
CHOL/HDL RATIO: 3.9 ratio
CHOLESTEROL: 224 mg/dL — AB (ref 0–200)
HDL: 57 mg/dL (ref >=46)
LDL Cholesterol: 147 mg/dL — ABNORMAL HIGH (ref 0–99)
TRIGLYCERIDES: 99 mg/dL (ref <150)
VLDL: 20 mg/dL (ref 0–40)

## 2015-02-12 LAB — COMPLETE METABOLIC PANEL WITH GFR
ALT: 13 U/L (ref 0–35)
AST: 19 U/L (ref 0–37)
Albumin: 3.8 g/dL (ref 3.5–5.2)
Alkaline Phosphatase: 73 U/L (ref 39–117)
BILIRUBIN TOTAL: 0.5 mg/dL (ref 0.2–1.2)
BUN: 16 mg/dL (ref 6–23)
CHLORIDE: 104 meq/L (ref 96–112)
CO2: 27 mEq/L (ref 19–32)
Calcium: 9.5 mg/dL (ref 8.4–10.5)
Creat: 0.63 mg/dL (ref 0.50–1.10)
GFR, Est African American: >89 mL/min
GFR, Est Non African American: >89 mL/min
GLUCOSE: 85 mg/dL (ref 70–99)
Potassium: 4.6 mEq/L (ref 3.5–5.3)
SODIUM: 140 meq/L (ref 135–145)
TOTAL PROTEIN: 7.3 g/dL (ref 6.0–8.3)

## 2015-02-12 NOTE — Assessment & Plan Note (Addendum)
-  Pap smear performed -CBC, CMP, lipid profile sent -HIV screen

## 2015-02-12 NOTE — Assessment & Plan Note (Signed)
No previous diagnosis of hypertension. BP on 12/10/2014 was 160/105. BP today 150/94.  Plan: -Patient to check blood pressure at home for 1 week and log measurements -Follow up in 1 week

## 2015-02-12 NOTE — Assessment & Plan Note (Addendum)
Assessment: Genitourinary odor for the past 6 months. Urinalysis in past has been unremarkable. Urine culture with no growth.  Plan: -Wet prep, GC/chlamydia sent -Urinalysis  ADDENDUM: UA with negative nitrite, negative leukocytes, rare squamous epithelial, 0-2 WBC, few bacteria. Few bacteria likely contaminant. Wet prep positive for candida. Rx sent for fluconazole 150mg  x 1 for uncomplicated vulvovaginal candidiasis.

## 2015-02-12 NOTE — Patient Instructions (Signed)
Please check your blood pressure at home for 1 week. Write down the blood pressures and bring them in to your next appointment.

## 2015-02-12 NOTE — Progress Notes (Signed)
   Subjective:   Patient ID: Jeanne Catalinaob Vasquez, female    DOB: [birth year], 59 y.o.   MRN: [id number]  HPI Jeanne Vasquez is a 59 year old woman with no significant medical history presenting for evaluation.  She was seen at urgent care 12/10/2014 for urinary odor, ear pain and change in hearing. UA unremarkable and urine culture with no growth. Her BP at that visit was 160/105. She was treated with Zyrtec and Rhinocort and referred to ENT for her ear pain/change in hearing.  Today she reports she still has the bad odor with urination. She has occasional dysuria. She has been having this intermittently for the past 6 months. She does not know if it correlates with any certain types of foods. She has had been having a little vaginal discharge - white colored. Denies vaginal itching presently, but has had it before. Denies fevers, chills, nausea, vomiting, hematuria.  She reports her problems with ear pain and change in hearing have resolved.  Review of Systems Constitutional: no fevers/chills Eyes: no vision changes Ears, nose, mouth, throat, and face: no cough Respiratory: no shortness of breath Cardiovascular: no chest pain Gastrointestinal: no nausea/vomiting, no abdominal pain, no constipation, no diarrhea Genitourinary: Occasional dysuria, no hematuria Integument: no rash Hematologic/lymphatic: no bleeding/bruising, no edema Musculoskeletal: no arthralgias, no myalgias Neurological: no paresthesias, no weakness  History reviewed. No pertinent past medical history.  No current outpatient prescriptions on file prior to visit.   No current facility-administered medications on file prior to visit.   Today's Vitals   02/12/15 0833  BP: 150/94  Pulse: 66  Temp: 98.2 F (36.8 C)  TempSrc: Oral  Height: 4\' 11"  (1.499 m)  Weight: 164 lb 3.2 oz (74.481 kg)  SpO2: 99%  PainSc: 0-No pain   Objective:  Physical Exam  Constitutional: She is oriented to person, place, and time. She appears  well-developed and well-nourished. No distress.  HENT:  Head: Normocephalic and atraumatic.  Eyes: Conjunctivae are normal.  Neck: Neck supple.  Cardiovascular: Normal rate and regular rhythm.   Pulmonary/Chest: Effort normal and breath sounds normal.  Abdominal: Soft. She exhibits no distension. There is no tenderness.  Genitourinary: Vaginal discharge (Scant, cream colored) found.  Musculoskeletal: Normal range of motion.  Neurological: She is alert and oriented to person, place, and time.  Skin: Skin is warm and dry.  Psychiatric: She has a normal mood and affect.   Assessment & Plan:  Please refer to problem based charting.

## 2015-02-13 ENCOUNTER — Ambulatory Visit: Payer: Self-pay | Admitting: Pulmonary Disease

## 2015-02-13 DIAGNOSIS — E785 Hyperlipidemia, unspecified: Secondary | ICD-10-CM | POA: Insufficient documentation

## 2015-02-13 HISTORY — DX: Hyperlipidemia, unspecified: E78.5

## 2015-02-13 LAB — URINALYSIS, COMPLETE
Bilirubin Urine: NEGATIVE
CRYSTALS: NONE SEEN
Casts: NONE SEEN
GLUCOSE, UA: NEGATIVE mg/dL
Hgb urine dipstick: NEGATIVE
Ketones, ur: NEGATIVE mg/dL
LEUKOCYTES UA: NEGATIVE
NITRITE: NEGATIVE
PH: 8 (ref 5.0–8.0)
Protein, ur: NEGATIVE mg/dL
Specific Gravity, Urine: 1.015 (ref 1.005–1.030)
Urobilinogen, UA: 0.2 mg/dL (ref 0.0–1.0)

## 2015-02-13 LAB — HIV ANTIBODY (ROUTINE TESTING W REFLEX): HIV 1&2 Ab, 4th Generation: NONREACTIVE

## 2015-02-13 LAB — CERVICOVAGINAL ANCILLARY ONLY
Chlamydia: NEGATIVE
Neisseria Gonorrhea: NEGATIVE
WET PREP (BD AFFIRM): POSITIVE — AB

## 2015-02-13 MED ORDER — FLUCONAZOLE 150 MG PO TABS: 150.0000 mg | ORAL_TABLET | Freq: Every day | ORAL | Status: DC

## 2015-02-13 NOTE — Addendum Note (Signed)
Addended by: Griffin BasilKRALL, Knoxx Boeding T on: 02/13/2015 09:07 AM   Modules accepted: Orders

## 2015-02-13 NOTE — Assessment & Plan Note (Signed)
Lipid Panel     Component Value Date/Time   CHOL 224* 02/12/2015 0930   TRIG 99 02/12/2015 0930   HDL 57 02/12/2015 0930   CHOLHDL 3.9 02/12/2015 0930   VLDL 20 02/12/2015 0930   LDLCALC 147* 02/12/2015 0930   4.8% 10-year risk of heart disease or stroke.   Plan: -Will recommend lifestyle modification at next appointment.

## 2015-02-14 LAB — CYTOLOGY - PAP

## 2015-02-18 ENCOUNTER — Encounter: Payer: Self-pay | Admitting: Pulmonary Disease

## 2015-02-18 ENCOUNTER — Ambulatory Visit (INDEPENDENT_AMBULATORY_CARE_PROVIDER_SITE_OTHER): Payer: Self-pay | Admitting: Pulmonary Disease

## 2015-02-18 VITALS — BP 129/86 | HR 72 | Temp 97.9°F | Ht 59.0 in | Wt 163.6 lb

## 2015-02-18 DIAGNOSIS — Z0000 Encounter for general adult medical examination without abnormal findings: Secondary | ICD-10-CM

## 2015-02-18 DIAGNOSIS — L239 Allergic contact dermatitis, unspecified cause: Secondary | ICD-10-CM | POA: Insufficient documentation

## 2015-02-18 DIAGNOSIS — IMO0001 Reserved for inherently not codable concepts without codable children: Secondary | ICD-10-CM

## 2015-02-18 DIAGNOSIS — R030 Elevated blood-pressure reading, without diagnosis of hypertension: Secondary | ICD-10-CM

## 2015-02-18 DIAGNOSIS — E785 Hyperlipidemia, unspecified: Secondary | ICD-10-CM

## 2015-02-18 DIAGNOSIS — B3731 Acute candidiasis of vulva and vagina: Secondary | ICD-10-CM

## 2015-02-18 DIAGNOSIS — B373 Candidiasis of vulva and vagina: Secondary | ICD-10-CM

## 2015-02-18 MED ORDER — LORATADINE 10 MG PO TABS: 10.0000 mg | ORAL_TABLET | Freq: Every day | ORAL | Status: DC

## 2015-02-18 MED ORDER — FLUCONAZOLE 150 MG PO TABS: 150.0000 mg | ORAL_TABLET | ORAL | Status: DC

## 2015-02-18 NOTE — Patient Instructions (Addendum)
Keep checking your blood pressure at home every week. If it is high (greater than 180/100), please call the clinic.  You have a lot of control over your blood pressure. To lower it: -Lose weight (if you are overweight) -Choose a diet low in fat and rich in fruits, vegetables, and low-fat dairy products -Reduce the amount of salt you eat -Do something active for at least 30 minutes a day on most days of the week -Cut down on alcohol (if you drink more than 2 alcoholic drinks per day)  Low Sodium Diet  What is sodium? - Sodium is the main ingredient in table salt. It is also found in lots of foods, and even in water. The body needs a very small amount of sodium to work normally, but most people eat much more sodium than their body needs.   Why should I cut down on sodium? - Reducing the amount of sodium you eat can have lots of health benefits:  -It can lower your blood pressure, which means it can help reduce your risk of stroke, heart attack, kidney damage, and lots of other health problems. -It can reduce the amount of fluid in your body, which means that your heart doesn't have to work as hard to push a lot of fluid around. -It can keep the kidneys from having to work too hard. This is especially important in people who have kidney disease. -It can reduce swelling in the ankles and belly, which can be uncomfortable and make it hard to move. -It can reduce the chances of forming kidney stones. -It can help keep your bones strong.  Which foods have the most sodium? - Processed foods have the most sodium. These foods usually come in cans, boxes, jars, and bags. They tend to have a lot of sodium even if they don't taste salty. In fact, many sweet foods have a lot of sodium in them. The only way to know for sure how much sodium you are getting is to check the label.  What should I do to reduce the amount of sodium in my diet? - Many people think that avoiding the salt shaker and not adding salt  to their food means that they are eating a low-sodium diet. This is not true. Not adding salt at the table or when cooking will help a little. But almost all of the sodium you eat is already in the food you buy at the grocery store or at restaurants  The most important thing you can do to cut down on sodium is to eat less processed food. That means that you should avoid most foods that are sold in cans, boxes, jars and bags. You should also eat in restaurants less often.   Instead of buying pre-made, processed foods, buy fresh or fresh-frozen fruits and vegetables. (Fresh-frozen foods are foods that are frozen without anything added to them.) Buy meats, fish, chicken, and [place] that are fresh instead of canned or sold at the deli counter. (Meats sold at the deli counter are high in sodium). Then try making meals from scratch at home using these low-sodium ingredients.  If you must buy canned or packaged foods, choose ones that are labeled "sodium free" or "very low sodium."  Or choose foods that have less than 400 milligrams of sodium in each serving.  (Source: UpToDate)

## 2015-02-18 NOTE — Progress Notes (Signed)
Internal Medicine Clinic Attending  Case discussed with Dr. Krall at the time of the visit.  We reviewed the resident's history and exam and pertinent patient test results.  I agree with the assessment, diagnosis, and plan of care documented in the resident's note.  

## 2015-02-18 NOTE — Progress Notes (Signed)
   Subjective:   Patient ID: Inez Catalina, female    DOB: 04-Mar-1956, 59 y.o.   MRN: [id number]  HPI Ms. Chadsity Sowder is a 59 year old woman with no significant medical history presenting for evaluation.  She was last seen in clinic 02/12/2015. She was prescribed fluconazole 150mg  x 1 for candida vulvovaginitis.  Since then, she reports her symptoms have improved some but still persist.  Review of Systems Constitutional: no fevers/chills Eyes: no vision changes Ears, nose, mouth, throat, and face: no cough Respiratory: no shortness of breath Cardiovascular: no chest pain Gastrointestinal: no nausea/vomiting, no abdominal pain, no constipation, no diarrhea Genitourinary: no dysuria, no hematuria Integument: no rash Hematologic/lymphatic: no bleeding/bruising, no edema Musculoskeletal: no arthralgias, no myalgias Neurological: no paresthesias, no weakness  Past Medical History  Diagnosis Date  . Hemorrhoids   . Hyperlipidemia 02/13/2015    Current Outpatient Prescriptions on File Prior to Visit  Medication Sig Dispense Refill  . fluconazole (DIFLUCAN) 150 MG tablet Take 1 tablet (150 mg total) by mouth daily. 1 tablet 0   No current facility-administered medications on file prior to visit.    Today's Vitals   02/18/15 0828  BP: 129/86  Pulse: 72  Temp: 97.9 F (36.6 C)  TempSrc: Oral  Height: 4\' 11"  (1.499 m)  Weight: 163 lb 9.6 oz (74.208 kg)  SpO2: 100%  PainSc: 0-No pain    Objective:  Physical Exam  Constitutional: She is oriented to person, place, and time. She appears well-developed and well-nourished. No distress.  HENT:  Head: Normocephalic and atraumatic.  Eyes: Conjunctivae are normal.  Neck: Neck supple.  Cardiovascular: Normal rate and regular rhythm.   Pulmonary/Chest: Effort normal and breath sounds normal.  Abdominal: Soft. She exhibits no distension. There is no tenderness.  Musculoskeletal: Normal range of motion.  Neurological: She is alert and oriented to  person, place, and time.  Skin: Skin is warm and dry.  Psychiatric: She has a normal mood and affect.    Assessment & Plan:  Please refer to problem based charting.

## 2015-02-18 NOTE — Assessment & Plan Note (Signed)
Pap smear negative. CMP unremarkable. CBC unremarkable. HIV negative.  Plan: -Referral for colonoscopy -Screening mammogram ordered

## 2015-02-18 NOTE — Assessment & Plan Note (Signed)
Blood pressure measurements at home were 141/90, 135/90, 130/90, 116/86, 131/83, 128/85. Blood pressure today in clinic 129/86.  Plan: -Referral to health coach -Discussed with patient healthy lifestyle changes such as low fat/low salt diet, exercise

## 2015-02-18 NOTE — Assessment & Plan Note (Addendum)
She reports some pruritus of her arms that started over the weekend. She reports it has been getting better. She had been outdoors a lot. Denies new lotions, detergents, fabric, etc. No sick contacts. Likely 2/2 allergic dermatitis related to environment.  Plan:  -Rx for loratadine 10mg  prn

## 2015-02-18 NOTE — Assessment & Plan Note (Signed)
As she has had this for the past 6 months, she may be considered complicated. She reports improvement of symptoms but no resolution. Denies fevers, chills, dysuria.  Plan: Fluconazole 150mg  Q72 hours for 2 doses.

## 2015-02-18 NOTE — Assessment & Plan Note (Signed)
Discussed results of lipid panel with patient.  Plan: -Health coach referral placed

## 2015-02-19 NOTE — Progress Notes (Signed)
Internal Medicine Clinic Attending  Case discussed with Dr. Krall at the time of the visit.  We reviewed the resident's history and exam and pertinent patient test results.  I agree with the assessment, diagnosis, and plan of care documented in the resident's note.  

## 2015-02-27 ENCOUNTER — Ambulatory Visit: Payer: Self-pay

## 2015-03-06 ENCOUNTER — Encounter: Payer: Self-pay | Admitting: Dietician

## 2015-03-06 ENCOUNTER — Telehealth: Payer: Self-pay | Admitting: Dietician

## 2015-03-06 NOTE — Telephone Encounter (Signed)
Call to patient to confirm appointment for 03/07/15 at 3:30 vm not set up

## 2015-03-07 ENCOUNTER — Encounter: Payer: Self-pay | Admitting: Dietician

## 2015-03-07 ENCOUNTER — Ambulatory Visit (INDEPENDENT_AMBULATORY_CARE_PROVIDER_SITE_OTHER): Payer: Self-pay | Admitting: Dietician

## 2015-03-07 VITALS — Wt 163.6 lb

## 2015-03-07 DIAGNOSIS — E785 Hyperlipidemia, unspecified: Secondary | ICD-10-CM

## 2015-03-07 DIAGNOSIS — E669 Obesity, unspecified: Secondary | ICD-10-CM

## 2015-03-07 DIAGNOSIS — Z713 Dietary counseling and surveillance: Secondary | ICD-10-CM

## 2015-03-07 DIAGNOSIS — Z6833 Body mass index (BMI) 33.0-33.9, adult: Secondary | ICD-10-CM

## 2015-03-07 NOTE — Patient Instructions (Addendum)
To lower your Cholesterol you need to :  Lose weight. You should weigh less than 150 pounds.  Take a walk every day  week 1- start with walking 10 minutes every day  week 2- start with wlkaing 15 minutes every day  week 3- start with wlkaing 20 minutes every day  week 4- start with wlkaing 25 minutes every day  week 5 and every week after that - start with walk 30 minutes every day  Also eat healthier=  1- Use brown rice instead of white rice 2- Eat half as much rice  3- Do not fry meats or any other food- soup is fine, baked, boiled, grilled or stir fry 4- Eat fruit instead of sweets like cake cookies or muffins.  5- Use canola or olive oil

## 2015-03-07 NOTE — Progress Notes (Signed)
Medical Nutrition Therapy:  Appt start time: 1530 end time:  1630. Used Media plannerlanguage line [place]ian interpreter [id number] Assessment:  Primary concerns today: Meal planning.  Patient is here for help in lowering her cholesterol. She speaks some [place], but does better with [place]. She does not read in [place]- had little schooling. She has a BM daily, is willing to make nay changes suggested and is very concerned about her cholesterol and her health.  Labs: LDL- 147, total cholesterol 224 Weight: 163.6#, stable at this weight, IBW- 90-100#,  BMI- 33- obese, goal < 150# Learning Readiness:Ready Barriers to learning/adherence to lifestyle change: support  Physical activity includes housework, cooking, caring for children. . Dietary intake:  Usual eating pattern includes 2 meals and 1 snacks per day. Frequent foods and beverages include rice, meat, vegetables  Avoided foods include milk.  24-hr recall: (Up at  7-9 AM)  Breakfast- 1 cup coffee with 1 tsp sugar and 1 tsp cream L ( PM)- 1 cup white rice, fried meat, vegetables/ soup  Snk ( PM)- sometimes sweets  D ( PM)- 1 cup white rice, fried meat, vegetables/soup   Beverages-water coffee, no juice, soda, tea or alcohol Bedtime- 9-10 PM and reports she sleeps well   Typical day? Yes.    Progress Towards Goal(s):  In progress.   Nutritional Diagnosis:  NB-1.1 Food and nutrition-related knowledge deficit As related to lack of prior training on how to lower lipids/cholesterol.  As evidenced by her report and questions.    Intervention:  Nutrition education about healthy lifestyle. Patient verbalized understanding to changes suggested today to lower her cholesterol and weight.   Handouts given during visit include:My plate handout and AVS Demonstrated degree of understanding via:  Teach Back   Monitoring/Evaluation:  Dietary intake, exercise, and body weight prn.

## 2015-03-12 NOTE — Addendum Note (Signed)
Addended by: KRALL, JENNIFER T on: 03/12/2015 08:39 AM   Modules accepted: Orders  

## 2015-03-19 ENCOUNTER — Encounter: Payer: Self-pay | Admitting: *Deleted

## 2015-04-19 NOTE — Addendum Note (Signed)
Addended by: Neomia Dear on: 04/19/2015 07:02 PM   Modules accepted: Orders

## 2015-05-10 ENCOUNTER — Other Ambulatory Visit: Payer: Self-pay | Admitting: Pulmonary Disease

## 2015-05-10 DIAGNOSIS — Z1211 Encounter for screening for malignant neoplasm of colon: Secondary | ICD-10-CM

## 2015-05-22 ENCOUNTER — Encounter: Payer: Self-pay | Admitting: *Deleted

## 2015-07-12 ENCOUNTER — Telehealth: Payer: Self-pay

## 2015-07-12 NOTE — Telephone Encounter (Signed)
Attempt to call pt to reschedule missed pv appt. Female answered phone and said the telephone number was incorrect. PV appt and procedure canceled due to no other tn's available.

## 2015-07-16 ENCOUNTER — Encounter: Payer: Self-pay | Admitting: Internal Medicine

## 2015-07-18 ENCOUNTER — Encounter: Payer: Self-pay | Admitting: Internal Medicine

## 2015-07-18 ENCOUNTER — Ambulatory Visit (INDEPENDENT_AMBULATORY_CARE_PROVIDER_SITE_OTHER): Payer: Self-pay | Admitting: Internal Medicine

## 2015-07-18 VITALS — BP 159/95 | HR 76 | Temp 97.8°F | Ht 59.0 in | Wt 164.1 lb

## 2015-07-18 DIAGNOSIS — J3089 Other allergic rhinitis: Secondary | ICD-10-CM

## 2015-07-18 DIAGNOSIS — K0889 Other specified disorders of teeth and supporting structures: Secondary | ICD-10-CM | POA: Insufficient documentation

## 2015-07-18 DIAGNOSIS — J309 Allergic rhinitis, unspecified: Secondary | ICD-10-CM | POA: Insufficient documentation

## 2015-07-18 MED ORDER — FLUTICASONE PROPIONATE 50 MCG/ACT NA SUSP: 1.0000 | Freq: Every day | NASAL | Status: DC

## 2015-07-18 NOTE — Progress Notes (Signed)
    Subjective:   Patient ID: Jeanne Vasquez female   DOB: 1956-01-11 59 y.o.   MRN: [id number]  HPI: [name] is a 59 y.o. woman with PMH noted below who is here for tooth pain , and dysuria. It was extremely difficult to get history from her even with the interpreter as she speaks [place]ian,  Please see Problem List/A&P for the status of the patient's chronic medical problems      Past Medical History  Diagnosis Date  . Hemorrhoids   . Hyperlipidemia 02/13/2015   Current Outpatient Prescriptions  Medication Sig Dispense Refill  . fluconazole (DIFLUCAN) 150 MG tablet Take 1 tablet (150 mg total) by mouth every 3 (three) days. 2 tablet 0  . loratadine (CLARITIN) 10 MG tablet Take 1 tablet (10 mg total) by mouth daily. 30 tablet 2   No current facility-administered medications for this visit.   Family History  Problem Relation Age of Onset  . Cancer Neg Hx   . Heart disease Neg Hx   . Hypertension Neg Hx    Social History   Social History  . Marital Status: Single    Spouse Name: N/A  . Number of Children: N/A  . Years of Education: 0   Occupational History  . Unemployed    Social History Main Topics  . Smoking status: Never Smoker   . Smokeless tobacco: Not on file  . Alcohol Use: No  . Drug Use: No  . Sexual Activity: Not on file   Other Topics Concern  . Not on file   Social History Narrative   Came from [place] by herself. She has 4 children.   Review of Systems: Review of Systems  Constitutional: Negative for fever, chills, weight loss and malaise/fatigue.  HENT: Negative for hearing loss.        Tooth pain  Respiratory: Negative for cough, hemoptysis and sputum production.   Cardiovascular: Negative for chest pain.  Gastrointestinal: Negative for nausea, vomiting, abdominal pain, diarrhea, constipation and blood in stool.  Genitourinary: Positive for dysuria. Negative for urgency, frequency and hematuria.  Neurological: Negative for headaches.     Objective:  Physical Exam: There were no vitals filed for this visit. Physical Exam  Constitutional: She appears well-developed and well-nourished.  HENT:  Head: Normocephalic and atraumatic.  Left cavity in the tooth- likely source of tooth pain No drainage or pus  Neck: Normal range of motion. Neck supple.  Cardiovascular: Normal rate, regular rhythm, normal heart sounds and intact distal pulses.   Pulmonary/Chest: Effort normal and breath sounds normal. No respiratory distress.  Abdominal: Soft. Bowel sounds are normal. She exhibits no distension and no mass. There is no tenderness. There is no guarding.  Skin: Skin is warm and dry.  Psychiatric: She has a normal mood and affect.    Assessment & Plan:   Please see problem based charting for assessment and plan

## 2015-07-18 NOTE — Assessment & Plan Note (Signed)
Pt with 3 wk history of tooth pain on lower left molar  Exam reveals cavity- no purulent drainage , no systemic symptoms  Referred to dentist and explained to patient  No need for abx

## 2015-07-18 NOTE — Assessment & Plan Note (Addendum)
Pt with 2 week history of hoarseness and postnasal drip  Prescribed flonase

## 2015-07-18 NOTE — Patient Instructions (Signed)
Thanks for your visit today Please go to the dentist for the tooth pain- you have cavity there Please use the flonase spray for your allergic rhinitis

## 2015-07-19 NOTE — Progress Notes (Signed)
Internal Medicine Clinic Attending  I saw and evaluated the patient.  I personally confirmed the key portions of the history and exam documented by Dr. Saraiya and I reviewed pertinent patient test results.  The assessment, diagnosis, and plan were formulated together and I agree with the documentation in the resident's note.  

## 2015-07-29 ENCOUNTER — Encounter: Payer: Self-pay | Admitting: Internal Medicine

## 2015-09-17 ENCOUNTER — Ambulatory Visit (AMBULATORY_SURGERY_CENTER): Payer: Self-pay

## 2015-09-17 VITALS — Ht <= 58 in | Wt 162.8 lb

## 2015-09-17 DIAGNOSIS — Z1211 Encounter for screening for malignant neoplasm of colon: Secondary | ICD-10-CM

## 2015-09-17 MED ORDER — SUPREP BOWEL PREP KIT 17.5-3.13-1.6 GM/177ML PO SOLN: 1.0000 | Freq: Once | ORAL | Status: DC

## 2015-09-17 NOTE — Progress Notes (Signed)
No allergies to eggs or soy No diet/weight loss meds No past exposure to anesthesia No home oxygen  No internet

## 2015-09-28 ENCOUNTER — Telehealth: Payer: Self-pay | Admitting: Internal Medicine

## 2015-09-28 ENCOUNTER — Encounter: Payer: Self-pay | Admitting: Internal Medicine

## 2015-09-28 NOTE — Telephone Encounter (Signed)
Cannot afford Suprep  \Needs MiraLax instructions for 1/24 colonoscopy please  Need previsit RN to call and get them MiraLax instructions  Patient speaks [place] so call son Dennie Bible

## 2015-09-30 ENCOUNTER — Ambulatory Visit (AMBULATORY_SURGERY_CENTER): Payer: Self-pay | Admitting: *Deleted

## 2015-09-30 ENCOUNTER — Encounter: Payer: Self-pay | Admitting: *Deleted

## 2015-09-30 DIAGNOSIS — Z1211 Encounter for screening for malignant neoplasm of colon: Secondary | ICD-10-CM

## 2015-09-30 MED ORDER — BISACODYL 5 MG PO TBEC: 5.0000 mg | DELAYED_RELEASE_TABLET | Freq: Once | ORAL | Status: DC

## 2015-09-30 MED ORDER — POLYETHYLENE GLYCOL 3350 17 GM/SCOOP PO POWD: 1.0000 | Freq: Once | ORAL | Status: DC

## 2015-09-30 NOTE — Telephone Encounter (Signed)
Patient will have her son come at 10:00 for new instructions

## 2015-10-01 ENCOUNTER — Encounter: Payer: Self-pay | Admitting: Internal Medicine

## 2015-10-01 ENCOUNTER — Ambulatory Visit (AMBULATORY_SURGERY_CENTER): Payer: Self-pay | Admitting: Internal Medicine

## 2015-10-01 VITALS — BP 117/77 | HR 82 | Temp 98.9°F | Resp 19 | Ht 59.0 in | Wt 164.0 lb

## 2015-10-01 DIAGNOSIS — Z1211 Encounter for screening for malignant neoplasm of colon: Secondary | ICD-10-CM

## 2015-10-01 MED ORDER — SODIUM CHLORIDE 0.9 % IV SOLN: 500.0000 mL | INTRAVENOUS | Status: DC

## 2015-10-01 NOTE — Progress Notes (Signed)
Report to PACU, RN, vss, BBS= Clear.  

## 2015-10-01 NOTE — Op Note (Signed)
[facility] [address] [place], [address]   COLONOSCOPY PROCEDURE REPORT  PATIENT: Jeanne Vasquez, Jeanne Vasquez  MR#: [id number] BIRTHDATE: 1956/02/09 , 59  yrs. old GENDER: female ENDOSCOPIST: Iva Boop, MD, [facility] PROCEDURE DATE:  10/01/2015 PROCEDURE:   Colonoscopy, screening First Screening Colonoscopy - Avg.  risk and is 50 yrs.  old or older Yes.  Prior Negative Screening - Now for repeat screening. N/A  History of Adenoma - Now for follow-up colonoscopy & has been > or = to 3 yrs.  N/A  Polyps removed today? No Recommend repeat exam, <10 yrs? No ASA CLASS:   Class II INDICATIONS:Screening for colonic neoplasia and Colorectal Neoplasm Risk Assessment for this procedure is average risk. MEDICATIONS: Propofol 150 mg IV and Monitored anesthesia care  DESCRIPTION OF PROCEDURE:   After the risks benefits and alternatives of the procedure were thoroughly explained, informed consent was obtained.  The digital rectal exam revealed no abnormalities of the rectum.   The [id number]  endoscope was introduced through the anus and advanced to the cecum, which was identified by both the appendix and ileocecal valve. No adverse events experienced.   The quality of the prep was excellent. (MiraLax was used)  The instrument was then slowly withdrawn as the colon was fully examined. Estimated blood loss is zero unless otherwise noted in this procedure report.      COLON FINDINGS: A normal appearing cecum, ileocecal valve, and appendiceal orifice were identified.  The ascending, transverse, descending, sigmoid colon, and rectum appeared unremarkable. Retroflexed views revealed no abnormalities. The time to cecum = 2.6 Withdrawal time = 6.4   The scope was withdrawn and the procedure completed. COMPLICATIONS: There were no immediate complications.  ENDOSCOPIC IMPRESSION: Normal colonoscopy  RECOMMENDATIONS: Repeat colonoscopy 10 years.  eSigned:  Iva Boop, MD,  [facility] 10/01/2015 12:06 PM   cc: The Patient and Dr. Griffin Basil

## 2015-10-01 NOTE — Patient Instructions (Signed)
Discharge instructions given. Normal exam. Resume previous medications. YOU HAD AN ENDOSCOPIC PROCEDURE TODAY AT THE [facility]:   Refer to the procedure report that was given to you for any specific questions about what was found during the examination.  If the procedure report does not answer your questions, please call your gastroenterologist to clarify.  If you requested that your care partner not be given the details of your procedure findings, then the procedure report has been included in a sealed envelope for you to review at your convenience later.  YOU SHOULD EXPECT: Some feelings of bloating in the abdomen. Passage of more gas than usual.  Walking can help get rid of the air that was put into your GI tract during the procedure and reduce the bloating. If you had a lower endoscopy (such as a colonoscopy or flexible sigmoidoscopy) you may notice spotting of blood in your stool or on the toilet paper. If you underwent a bowel prep for your procedure, you may not have a normal bowel movement for a few days.  Please Note:  You might notice some irritation and congestion in your nose or some drainage.  This is from the oxygen used during your procedure.  There is no need for concern and it should clear up in a day or so.  SYMPTOMS TO REPORT IMMEDIATELY:   Following lower endoscopy (colonoscopy or flexible sigmoidoscopy):  Excessive amounts of blood in the stool  Significant tenderness or worsening of abdominal pains  Swelling of the abdomen that is new, acute  Fever of 100F or higher   For urgent or emergent issues, a gastroenterologist can be reached at any hour by calling [phone].   DIET: Your first meal following the procedure should be a small meal and then it is ok to progress to your normal diet. Heavy or fried foods are harder to digest and may make you feel nauseous or bloated.  Likewise, meals heavy in dairy and vegetables can increase bloating.  Drink plenty  of fluids but you should avoid alcoholic beverages for 24 hours.  ACTIVITY:  You should plan to take it easy for the rest of today and you should NOT DRIVE or use heavy machinery until tomorrow (because of the sedation medicines used during the test).    FOLLOW UP: Our staff will call the number listed on your records the next business day following your procedure to check on you and address any questions or concerns that you may have regarding the information given to you following your procedure. If we do not reach you, we will leave a message.  However, if you are feeling well and you are not experiencing any problems, there is no need to return our call.  We will assume that you have returned to your regular daily activities without incident.  If any biopsies were taken you will be contacted by phone or by letter within the next 1-3 weeks.  Please call us at [phone] if you have not heard about the biopsies in 3 weeks.    SIGNATURES/CONFIDENTIALITY: You and/or your care partner have signed paperwork which will be entered into your electronic medical record.  These signatures attest to the fact that that the information above on your After Visit Summary has been reviewed and is understood.  Full responsibility of the confidentiality of this discharge information lies with you and/or your care-partner. 

## 2015-10-02 ENCOUNTER — Telehealth: Payer: Self-pay | Admitting: *Deleted

## 2015-10-02 NOTE — Telephone Encounter (Signed)
No answer, voice mail not set up on phone.

## 2015-10-21 ENCOUNTER — Ambulatory Visit: Payer: Self-pay

## 2015-10-31 ENCOUNTER — Ambulatory Visit: Payer: Self-pay

## 2016-05-13 ENCOUNTER — Ambulatory Visit: Payer: Self-pay

## 2016-08-19 ENCOUNTER — Telehealth: Payer: Self-pay | Admitting: Pulmonary Disease

## 2016-08-19 NOTE — Telephone Encounter (Signed)
APT. REMINDER CALL, NO ANSWER, MAILBOX FULL °

## 2016-08-20 ENCOUNTER — Ambulatory Visit (HOSPITAL_COMMUNITY)
Admission: RE | Admit: 2016-08-20 | Discharge: 2016-08-20 | Disposition: A | Discharge status: Home / Self Care | Payer: Self-pay | Source: Ambulatory Visit | Attending: Family Medicine | Admitting: Family Medicine

## 2016-08-20 ENCOUNTER — Ambulatory Visit (INDEPENDENT_AMBULATORY_CARE_PROVIDER_SITE_OTHER): Payer: Self-pay | Admitting: Pulmonary Disease

## 2016-08-20 ENCOUNTER — Encounter: Payer: Self-pay | Admitting: Pulmonary Disease

## 2016-08-20 VITALS — BP 127/82 | HR 92 | Temp 98.2°F | Wt 168.8 lb

## 2016-08-20 DIAGNOSIS — R079 Chest pain, unspecified: Secondary | ICD-10-CM

## 2016-08-20 DIAGNOSIS — R030 Elevated blood-pressure reading, without diagnosis of hypertension: Secondary | ICD-10-CM

## 2016-08-20 DIAGNOSIS — R8290 Unspecified abnormal findings in urine: Secondary | ICD-10-CM

## 2016-08-20 DIAGNOSIS — R0789 Other chest pain: Secondary | ICD-10-CM

## 2016-08-20 DIAGNOSIS — Z23 Encounter for immunization: Secondary | ICD-10-CM

## 2016-08-20 DIAGNOSIS — Z0000 Encounter for general adult medical examination without abnormal findings: Secondary | ICD-10-CM

## 2016-08-20 LAB — POCT URINALYSIS DIPSTICK
BILIRUBIN UA: NEGATIVE
Blood, UA: NEGATIVE
Glucose, UA: NEGATIVE
KETONES UA: NEGATIVE
LEUKOCYTES UA: NEGATIVE
Nitrite, UA: NEGATIVE
PH UA: 7
Protein, UA: NEGATIVE
SPEC GRAV UA: 1.02
Urobilinogen, UA: 1

## 2016-08-20 MED ORDER — RANITIDINE HCL 150 MG PO CAPS: 150.0000 mg | ORAL_CAPSULE | Freq: Two times a day (BID) | ORAL | 2 refills | Status: DC

## 2016-08-20 NOTE — Patient Instructions (Addendum)
Please take ranitidine twice a day for heartburn Please try drinking more water Follow up in 3 months

## 2016-08-20 NOTE — Progress Notes (Signed)
   CC: urine odor  HPI:  [name] is a 60 y.o. woman with hyperlipidemia presenting with urine odor and chest pain.  She reports urine odor.  She has chest pain. A little bit right now. Occurring for the past month. Intermittent. Does not occur every day. Lasts 1-2 minutes. Located on right side of chest. Stabbing in nature. Some burning sensation. No pressure or tightness. No radiation. No associated dyspnea. No nausea or diaphoresis. No family history of CAD as far as she knows. Sometimes gets pain when she walks. No dyspnea. No orthopnea. No PND. Occurs during the day. No tobacco use. Similar pain 10 years ago. Thought it was heartburn at the time. Took medicine for heartburn at the time that seemed to help.  Past Medical History:  Diagnosis Date  . Hemorrhoids   . Hyperlipidemia 02/13/2015    Review of Systems:   No fevers or chills No dysuria but a little pink Sometimes has vaginal discharge that is white  Physical Exam:  Vitals:   08/20/16 1453  BP: 127/82  Pulse: 92  Temp: 98.2 F (36.8 C)  SpO2: 98%  Weight: 168 lb 12.8 oz (76.6 kg)    General Apperance: NAD HEENT: Normocephalic, atraumatic, anicteric sclera Neck: Supple, trachea midline Lungs: Clear to auscultation bilaterally. No wheezes, rhonchi or rales. Breathing comfortably Heart: Regular rate and rhythm, no murmur/rub/gallop Abdomen: Soft, nontender, nondistended, no rebound/guarding Extremities: Warm and well perfused, no edema Skin: No rashes or lesions Neurologic: Alert and interactive. No gross deficits.  I independently reviewed the EKG which shows normal sinus rhythm. T wave inversion in III. No previous EKG for comparison.   Assessment & Plan:   See Encounters Tab for problem based charting.  Patient discussed with Dr. Cyndie ChimeGranfortuna

## 2016-08-21 DIAGNOSIS — R8290 Unspecified abnormal findings in urine: Secondary | ICD-10-CM | POA: Insufficient documentation

## 2016-08-21 DIAGNOSIS — R079 Chest pain, unspecified: Secondary | ICD-10-CM | POA: Insufficient documentation

## 2016-08-21 LAB — BMP8+ANION GAP
Anion Gap: 15 mmol/L (ref 10.0–18.0)
BUN / CREAT RATIO: 23 (ref 12–28)
BUN: 15 mg/dL (ref 8–27)
CHLORIDE: 101 mmol/L (ref 96–106)
CO2: 25 mmol/L (ref 18–29)
Calcium: 9.5 mg/dL (ref 8.7–10.3)
Creatinine, Ser: 0.64 mg/dL (ref 0.57–1.00)
GFR calc non Af Amer: 97 mL/min/{1.73_m2} (ref >59)
GFR, EST AFRICAN AMERICAN: 111 mL/min/{1.73_m2} (ref >59)
GLUCOSE: 108 mg/dL — AB (ref 65–99)
Potassium: 4.5 mmol/L (ref 3.5–5.2)
SODIUM: 141 mmol/L (ref 134–144)

## 2016-08-21 NOTE — Assessment & Plan Note (Signed)
Urine dip with no infection or blood. She was only able to provide a small urine sample. Suspect a component of the urine odor is that her urine is concentrated due to not staying hydrated. Encouraged hydration.

## 2016-08-21 NOTE — Assessment & Plan Note (Signed)
Assessment: BP today 127/82.   Plan: No medication required.

## 2016-08-21 NOTE — Assessment & Plan Note (Signed)
Flu shot administered.

## 2016-08-21 NOTE — Progress Notes (Signed)
Medicine attending: Medical history, presenting problems, physical findings, and medications, reviewed with resident physician Dr Jennifer Krall on the day of the patient visit and I concur with her evaluation and management plan. 

## 2016-08-21 NOTE — Assessment & Plan Note (Signed)
Assessment: Intermittent chest pain for the past month that has a stabbing and sometimes burning sensation to it. Previously responded to GERD medication. EKG with no acute abnormalities. Nonspecific T wave abnormality. Low risk for cardiac etiology.  Plan: Start ranitidine 150mg  BID

## 2016-11-18 ENCOUNTER — Telehealth: Payer: Self-pay | Admitting: Pulmonary Disease

## 2016-11-18 NOTE — Telephone Encounter (Signed)
APT. REMINDER CALL, LMTCB °

## 2016-11-19 ENCOUNTER — Encounter: Payer: Self-pay | Admitting: Pulmonary Disease

## 2016-12-30 ENCOUNTER — Ambulatory Visit: Payer: Self-pay

## 2017-05-13 ENCOUNTER — Ambulatory Visit (INDEPENDENT_AMBULATORY_CARE_PROVIDER_SITE_OTHER): Payer: Self-pay | Admitting: Internal Medicine

## 2017-05-13 VITALS — BP 158/91 | HR 84 | Temp 98.1°F | Ht 59.0 in | Wt 162.9 lb

## 2017-05-13 DIAGNOSIS — N390 Urinary tract infection, site not specified: Secondary | ICD-10-CM | POA: Insufficient documentation

## 2017-05-13 DIAGNOSIS — N3001 Acute cystitis with hematuria: Secondary | ICD-10-CM

## 2017-05-13 DIAGNOSIS — N3000 Acute cystitis without hematuria: Secondary | ICD-10-CM

## 2017-05-13 DIAGNOSIS — R030 Elevated blood-pressure reading, without diagnosis of hypertension: Secondary | ICD-10-CM

## 2017-05-13 LAB — POCT URINALYSIS DIPSTICK
BILIRUBIN UA: NEGATIVE
Glucose, UA: NEGATIVE
Ketones, UA: NEGATIVE
LEUKOCYTES UA: NEGATIVE
NITRITE UA: NEGATIVE
PH UA: 6.5 (ref 5.0–8.0)
PROTEIN UA: NEGATIVE
SPEC GRAV UA: 1.015 (ref 1.010–1.025)
UROBILINOGEN UA: 4 U/dL — AB

## 2017-05-13 MED ORDER — NITROFURANTOIN MONOHYD MACRO 100 MG PO CAPS: 100.0000 mg | ORAL_CAPSULE | Freq: Two times a day (BID) | ORAL | 0 refills | Status: AC

## 2017-05-13 NOTE — Assessment & Plan Note (Signed)
Patient with signs and symptoms consistent with acute cystitis. Point-of-care urine dipstick was positive for trace blood but negative for other signs of infection, which may be due to her consumption antibiotics.  Plan  --Macrobid 100 mg twice a day 5 days  --Will have patient get assigned a PCP in follow-up in about one month for routine health maintenance.

## 2017-05-13 NOTE — Patient Instructions (Addendum)
For your UTI, take macrobid one tab twice a day for 5 days.   Return to clinic if your symptoms are getting worse, you start having fevers, vomiting, back pain.  Please make an appointment to be seen in about one month for your regular health maintenance and check up.

## 2017-05-13 NOTE — Progress Notes (Signed)
   CC: Dysuria  HPI:  [name] is a 61 y.o. with a PMH of hyperlipidemia and elevated blood pressure extending to clinic for dysuria.  Patient states that she has had 3 day history of dysuria, and increased urinary urgency and frequency. She endorses some chills but denies fever. She denies suprapubic pain, flank pain, nausea, vomiting, hematuria, vaginal discharge, vaginal itching/discomfort. Patient states that she took a couple of her son's extra amoxicillin tablets this felt a little bit better. She has also started taking cranberry pills without much relief. She has not had similar episode in the past.  Please see problem based Assessment and Plan for status of patients chronic conditions.  Past Medical History:  Diagnosis Date  . Hemorrhoids   . Hyperlipidemia 02/13/2015    Review of Systems:   ROS  per history of present illness.  Physical Exam:  Vitals:   05/13/17 1526  BP: (!) 158/91  Pulse: 84  Temp: 98.1 F (36.7 C)  TempSrc: Oral  SpO2: 99%  Weight: 162 lb 14.4 oz (73.9 kg)  Height: 4\' 11"  (1.499 m)   GENERAL- alert, co-operative, appears as stated age, not in any distress. HEENT- Atraumatic, normocephalic, EOMI CARDIAC- RRR, no murmurs, rubs or gallops. RESP- Moving equal volumes of air, and clear to auscultation bilaterally, no wheezes or crackles. ABDOMEN- Soft, nontender, bowel sounds present, no CVA tenderness. NEURO- No obvious Cr N abnormality. EXTREMITIES- pulse 2+, symmetric. SKIN- Warm, dry, No rash or lesion. PSYCH- Normal mood and affect, appropriate thought content and speech.  Assessment & Plan:   See Encounters Tab for problem based charting.   Patient discussed with Dr. Fredrich RomansMullen   Nyra Anspaugh, MD Internal Medicine PGY2

## 2017-05-17 NOTE — Addendum Note (Signed)
Addended by: Debe CoderMULLEN, Evelynn Hench B on: 05/17/2017 03:31 PM   Modules accepted: Level of Service

## 2017-05-20 NOTE — Progress Notes (Signed)
Internal Medicine Clinic Attending  Case discussed with Dr. Svalina  at the time of the visit.  We reviewed the resident's history and exam and pertinent patient test results.  I agree with the assessment, diagnosis, and plan of care documented in the resident's note.  

## 2017-06-28 ENCOUNTER — Encounter: Payer: Self-pay | Admitting: Internal Medicine

## 2017-10-14 ENCOUNTER — Ambulatory Visit: Payer: Self-pay

## 2017-11-22 ENCOUNTER — Ambulatory Visit (INDEPENDENT_AMBULATORY_CARE_PROVIDER_SITE_OTHER): Payer: Self-pay | Admitting: Internal Medicine

## 2017-11-22 VITALS — BP 145/100 | HR 86 | Temp 98.1°F | Ht 59.0 in | Wt 163.9 lb

## 2017-11-22 DIAGNOSIS — Z79899 Other long term (current) drug therapy: Secondary | ICD-10-CM

## 2017-11-22 DIAGNOSIS — R39198 Other difficulties with micturition: Secondary | ICD-10-CM

## 2017-11-22 DIAGNOSIS — I10 Essential (primary) hypertension: Secondary | ICD-10-CM

## 2017-11-22 DIAGNOSIS — L299 Pruritus, unspecified: Secondary | ICD-10-CM | POA: Insufficient documentation

## 2017-11-22 DIAGNOSIS — N3001 Acute cystitis with hematuria: Secondary | ICD-10-CM

## 2017-11-22 LAB — POCT URINALYSIS DIPSTICK
BILIRUBIN UA: NEGATIVE
Blood, UA: NEGATIVE
Glucose, UA: NEGATIVE
KETONES UA: NEGATIVE
Leukocytes, UA: NEGATIVE
Nitrite, UA: NEGATIVE
PH UA: 6 (ref 5.0–8.0)
Protein, UA: NEGATIVE
Spec Grav, UA: 1.02 (ref 1.010–1.025)
Urobilinogen, UA: 0.2 E.U./dL

## 2017-11-22 MED ORDER — FEXOFENADINE HCL 180 MG PO TABS: 180.0000 mg | ORAL_TABLET | Freq: Every day | ORAL | 2 refills | Status: DC

## 2017-11-22 MED ORDER — LISINOPRIL 10 MG PO TABS: 10.0000 mg | ORAL_TABLET | Freq: Every day | ORAL | 11 refills | Status: DC

## 2017-11-22 MED FILL — LISINOPRIL 10 MG TABS: 10 | 30 days supply | Qty: 30 | Fill #0

## 2017-11-22 NOTE — Assessment & Plan Note (Signed)
BP Readings from Last 3 Encounters:  11/22/17 (!) 145/100  05/13/17 (!) 158/91  08/20/16 127/82   Her blood pressure was elevated today, which was elevated during previous office visit.  Patient does not check her blood pressure at home.  She was started on lisinopril 10 mg daily. We will reevaluate during next follow-up visit in 2 weeks.

## 2017-11-22 NOTE — Progress Notes (Signed)
   CC: Generalized pruritus for more than a year.  HPI:  Jeanne Vasquez is a 62 y.o. with no significant past medical history came to the clinic with complaint of generalized pruritus for more than a year, according to patient she started generalized itching after getting flu shot in 2017.  Itching is all over her body and worse at night, denies any specific rash, denies any bedbugs, she denies any change in her laundry detergent or personal care products.  She denies any watery eyes or runny nose other allergic symptoms like.  She normally drinks 2-3 cups of water and one big cup of coffee in a day.  She denies any nausea, vomiting, abdominal pain, change in her bowel habits or appetite.  She denies any unintentional weight loss.  She was also complaining of bad smelling urine, denies any increased urinary frequency, burning micturition, hematuria or lower abdominal pain.  Urine dipstick was negative for any blood, nitrites or leukocytes.  History was obtained with the help of an interpreter as patient speaks [place]ian.  Past Medical History:  Diagnosis Date  . Hemorrhoids   . Hyperlipidemia 02/13/2015   Review of Systems: Negative except mentioned in HPI.  Physical Exam:  Vitals:   11/22/17 1438  BP: (!) 149/94  Pulse: 86  Temp: 98.1 F (36.7 C)  TempSrc: Oral  SpO2: 99%  Weight: 163 lb 14.4 oz (74.3 kg)  Height: 4\' 11"  (1.499 m)    General: Vital signs reviewed.  Patient is well-developed and well-nourished, in no acute distress and cooperative with exam.  Head: Normocephalic and atraumatic. Eyes: EOMI, conjunctivae normal, no scleral icterus.  Cardiovascular: RRR, S1 normal, S2 normal, no murmurs, gallops, or rubs. Pulmonary/Chest: Clear to auscultation bilaterally, no wheezes, rales, or rhonchi. Abdominal: Soft, non-tender, non-distended, BS +, no masses, organomegaly, or guarding present.  Extremities: No lower extremity edema bilaterally,  pulses symmetric and intact  bilaterally. No cyanosis or clubbing. Skin: Warm, dry and intact. No rashes or erythema. Psychiatric: Normal mood and affect. speech and behavior is normal. Cognition and memory are normal.  Assessment & Plan:   See Encounters Tab for problem based charting.  Patient discussed with Dr. Heide SparkNarendra.

## 2017-11-22 NOTE — Assessment & Plan Note (Signed)
Her urine dipstick was negative for any infection.  She does not have any other urinary symptoms except her urine having a strong odor.  I advised her to drink plenty of water.

## 2017-11-22 NOTE — Progress Notes (Signed)
Internal Medicine Clinic Attending  Case discussed with Dr. Amin at the time of the visit.  We reviewed the resident's history and exam and pertinent patient test results.  I agree with the assessment, diagnosis, and plan of care documented in the resident's note.    

## 2017-11-22 NOTE — Assessment & Plan Note (Addendum)
She has very nonspecific symptoms of generalized pruritus. She had pretty much similar complaints in 2016 and was given loratadine at that time. No rash noted during current visit.  Prescription of Allegra. We will check CMP and lipid profile. She was advised to keep her skin well moist with aquaphor or Eucerin.

## 2017-11-22 NOTE — Patient Instructions (Signed)
Thank you for visiting clinic today. I am giving you a prescription for Allegra, you will take it daily for your itching. Try using Aquaphor or Eucerin all over your body to keep it well moist. Keep yourself well-hydrated. I am also checking some labs-we will call you if they are abnormal. As we discussed your blood pressure is high, it was high during your previous office visit too, I am starting you on a blood pressure medication called lisinopril 10 mg daily. Most common side effect is cough, and a rare side effect is swelling of your face and lips, if you experience any of those please stop taking this medicine and call the clinic. Please follow-up with your PCP in 2 weeks.

## 2017-11-23 LAB — CMP14 + ANION GAP
A/G RATIO: 1.2 (ref 1.2–2.2)
ALBUMIN: 4 g/dL (ref 3.6–4.8)
ALT: 13 IU/L (ref 0–32)
ANION GAP: 17 mmol/L (ref 10.0–18.0)
AST: 17 IU/L (ref 0–40)
Alkaline Phosphatase: 81 IU/L (ref 39–117)
BUN / CREAT RATIO: 26 (ref 12–28)
BUN: 21 mg/dL (ref 8–27)
Bilirubin Total: 0.2 mg/dL (ref 0.0–1.2)
CALCIUM: 9.2 mg/dL (ref 8.7–10.3)
CO2: 23 mmol/L (ref 20–29)
CREATININE: 0.81 mg/dL (ref 0.57–1.00)
Chloride: 103 mmol/L (ref 96–106)
GFR calc Af Amer: 90 mL/min/{1.73_m2} (ref >59)
GFR, EST NON AFRICAN AMERICAN: 78 mL/min/{1.73_m2} (ref >59)
GLOBULIN, TOTAL: 3.4 g/dL (ref 1.5–4.5)
Glucose: 111 mg/dL — ABNORMAL HIGH (ref 65–99)
POTASSIUM: 4.1 mmol/L (ref 3.5–5.2)
SODIUM: 143 mmol/L (ref 134–144)
Total Protein: 7.4 g/dL (ref 6.0–8.5)

## 2017-11-23 LAB — LIPID PANEL
CHOL/HDL RATIO: 3 ratio (ref 0.0–4.4)
Cholesterol, Total: 199 mg/dL (ref 100–199)
HDL: 66 mg/dL (ref >39)
LDL Calculated: 107 mg/dL — ABNORMAL HIGH (ref 0–99)
Triglycerides: 132 mg/dL (ref 0–149)
VLDL Cholesterol Cal: 26 mg/dL (ref 5–40)

## 2017-12-20 ENCOUNTER — Other Ambulatory Visit: Payer: Self-pay

## 2017-12-20 ENCOUNTER — Ambulatory Visit (INDEPENDENT_AMBULATORY_CARE_PROVIDER_SITE_OTHER): Payer: Self-pay | Admitting: Internal Medicine

## 2017-12-20 VITALS — BP 158/80 | HR 78 | Temp 98.4°F | Ht 59.0 in | Wt 163.0 lb

## 2017-12-20 DIAGNOSIS — Z23 Encounter for immunization: Secondary | ICD-10-CM

## 2017-12-20 DIAGNOSIS — Z0000 Encounter for general adult medical examination without abnormal findings: Secondary | ICD-10-CM

## 2017-12-20 DIAGNOSIS — I10 Essential (primary) hypertension: Secondary | ICD-10-CM

## 2017-12-20 DIAGNOSIS — E785 Hyperlipidemia, unspecified: Secondary | ICD-10-CM

## 2017-12-20 DIAGNOSIS — R238 Other skin changes: Secondary | ICD-10-CM

## 2017-12-20 DIAGNOSIS — R200 Anesthesia of skin: Secondary | ICD-10-CM | POA: Insufficient documentation

## 2017-12-20 DIAGNOSIS — Z79899 Other long term (current) drug therapy: Secondary | ICD-10-CM

## 2017-12-20 DIAGNOSIS — L299 Pruritus, unspecified: Secondary | ICD-10-CM

## 2017-12-20 MED ORDER — LISINOPRIL 20 MG PO TABS: 20.0000 mg | ORAL_TABLET | Freq: Every day | ORAL | 1 refills | Status: DC

## 2017-12-20 MED FILL — LISINOPRIL 20 MG TABLET: 20 | 30 days supply | Qty: 30 | Fill #0

## 2017-12-20 NOTE — Assessment & Plan Note (Signed)
TDAP administered in clinic

## 2017-12-20 NOTE — Assessment & Plan Note (Signed)
TChol 199, TG 132, HDL 66, LDL 107. ASCVD 10-year risk of 7.4%.  - Will continue to monitor, no statin for now

## 2017-12-20 NOTE — Assessment & Plan Note (Signed)
Assessment Reports 2 days of intermittent numbness of fingertips of bilateral hands. Denies color changes with weather extremes that would suggest Raynaud's. Denies weakness or pain. Distribution not consistent with carpal tunnel syndrome. Denies numbness of lower extremities. Exam negative for Tinel's sign and normal grip strength/finger abduction.   Unclear etiology at this point, but does not seem to be related to Raynaud's, diabetes, or carpal tunnel syndrome. Will manage conservatively with observation for now. Advised patient that if she starts dropping things or if the numbness worsens, she should call us or return to clinic sooner.  Plan - RTC in 2 weeks

## 2017-12-20 NOTE — Patient Instructions (Addendum)
??????????????? ???????:   2 ??????? ???????: ???????????? BP ?????????????????: ?????   ?????????,  ????????????????????????????????????????  ??????????????????????????????? Lisinopril 20mg  ?????????????? ???????????????????????? 4 ??????????????????????????  ????????????????????????????????????????????????????????????????? ?????????? Eucerin ? Aquaphor ???????  ???????????????????????????????????????????????????????????????????????????? ??????????????????????????????????????????????????????????????????????????????????????  ????????????????????????? 2 ?????????????????????????????????????????????????   karnena banteab  pelvelea: 2 sa bta  samreab: karokrobkrong BP  avei del trauv noam yk: thnam lokasrei   phi, vea pitchea rikreay nasa del ban chuob anak now [place] .  champoh sampeath chheam robsa anak saum banthem Lisinopril 20mg  chea riengrealthngai . anak ach tinh vea ban knong tamlei 4 dolla now aosathasthan hle n sai n .  champoh sbek robsa anak vea tomnongchea teaktng tow nung kar phlasa btau r pi preahatity . anak ach tinh Eucerin ryy Aquaphor now leu to . champoh kar spoek robsa anak nowknong dai robsa anak khnhom min chbasa tha tae avei del b nta l aoy nih . yeung nung bant tamdan rueng nih now pelnih haey brasenbae vea kante akrak cheang nih saum leukayk mk meul now [employer]leukakraoy . saum tralbmokvinh knongorypel 2 sa bta daembi truotpinity sampeath chheam ning chheam robsa anak .   FOLLOW-UP INSTRUCTIONS When: 2 weeks For: BP management What to bring: medications   [name],  It was a pleasure to meet you today.  For your blood pressure, please increase to lisinopril 20mg  daily. You can get this for $4 at [facility].  For your skin, it is most likely related to changes from the sun. You can buy Eucerin or Aquaphor over the counter.  For your numbness in your hands, I am not exactly sure what is causing this. We will continue to watch this for  now and if it gets a lot worse, please bring this up at your next visit.  Please return in 2 weeks to recheck your blood pressure and your blood work.

## 2017-12-20 NOTE — Progress Notes (Signed)
   CC: management of HTN  HPI:  [name] is a 62 y.o. female with PMH of HTN who presents for management of BP. History obtained with assistance from audio interpreter, as patient speaks [place]ian/Khmer.  BP: BP 141/79, repeat 158/80. She reports compliance with lisinopril 10mg  daily. Denies CP, HA, or dizziness/lightheadedness.  Bilateral fingertip numbness: reports 2 days of numbness of her bilateral fingertips that comes and goes. She reports it is all of her fingertips. Denies weakness or dropping things. Denies pain or color changes. The numbness is intermittent and is not related to a particular pattern.  Skin changes: She also reports skin changes on her bilateral upper and lower extremities for the last few months. She states her skin used to be light, but recently, she has noticed that her skin will intermittently become red. She denies pain or pruritus. The skin changes appear for various periods of time before resolving on their own. She was recommended to use eucerin or aquaphor but states she was not able to pick this up because she cannot read.   Pruritus, watery eyes: She did take allegra for a few days for pruritus/eye watering and itchiness, however this resolved, so she stopped taking allegra.  Please see the assessment and plan below for the status of the patient's chronic medical problems.  Past Medical History:  Diagnosis Date  . Hemorrhoids   . Hyperlipidemia 02/13/2015   Review of Systems:   Negative except as per HPI  Physical Exam:  Vitals:   12/20/17 1419 12/20/17 1519  BP: (!) 141/79 (!) 158/80  Pulse: 78   Temp: 98.4 F (36.9 C)   TempSrc: Oral   SpO2: 100%   Weight: 163 lb (73.9 kg)   Height: 4\' 11"  (1.499 m)    GEN: Sitting in chair in NAD HENT: Hyperpigmentation of bilateral cheeks. No conjunctival injection or tearing CV: NR & RR, no m/r/g EXT: No LE edema. Numbness of all fingertips bilaterally. 5/5 grip strength and finger abduction. 1+ radial  pulses bilaterally. Negative Tinel's sign. No numbness of lower extremities/toes. SKIN: Mild non-raised hyperpigmentation/erythema of bilateral lateral/dorsal upper extremities, less evident on lower extremities. No scaling or flaking.  Assessment & Plan:   See Encounters Tab for problem based charting.  Patient discussed with Dr. Cleda DaubE. Hoffman

## 2017-12-20 NOTE — Assessment & Plan Note (Signed)
Assessment BP 141/79, repeat 158/80. Reports compliance with lisinopril 10mg  daily. Asymptomatic. Will increase and re-check BP and labs in 2 weeks  Plan - Increase to lisinopril 20mg  daily - RTC in 2 weeks - BMP at next visit

## 2017-12-20 NOTE — Assessment & Plan Note (Signed)
Assessment She took allegra for several days for the watery/itchy left eye and generalized pruritus. She reports this cleared up, so she stopped taking the allegra. CMP and lipid profile wnl. Reports she was unable to get Eucerin or Aquaphor due to not knowing how to read. Advised her to speak to pharmacist or staffer at pharmacy to assist with getting the lotion.  Plan - Continue to monitor - Can use aquaphor or Eucerin

## 2017-12-21 NOTE — Progress Notes (Signed)
Internal Medicine Clinic Attending  Case discussed with Dr. Huang at the time of the visit.  We reviewed the resident's history and exam and pertinent patient test results.  I agree with the assessment, diagnosis, and plan of care documented in the resident's note. 

## 2018-01-10 ENCOUNTER — Ambulatory Visit (INDEPENDENT_AMBULATORY_CARE_PROVIDER_SITE_OTHER): Payer: Self-pay | Admitting: Internal Medicine

## 2018-01-10 ENCOUNTER — Encounter: Payer: Self-pay | Admitting: Internal Medicine

## 2018-01-10 ENCOUNTER — Ambulatory Visit: Payer: Self-pay

## 2018-01-10 ENCOUNTER — Other Ambulatory Visit: Payer: Self-pay

## 2018-01-10 VITALS — BP 126/82 | HR 87 | Temp 99.2°F | Ht 59.0 in | Wt 162.7 lb

## 2018-01-10 DIAGNOSIS — Z79899 Other long term (current) drug therapy: Secondary | ICD-10-CM

## 2018-01-10 DIAGNOSIS — I10 Essential (primary) hypertension: Secondary | ICD-10-CM

## 2018-01-10 DIAGNOSIS — E785 Hyperlipidemia, unspecified: Secondary | ICD-10-CM

## 2018-01-10 MED ORDER — LISINOPRIL 20 MG PO TABS: 20.0000 mg | ORAL_TABLET | Freq: Every day | ORAL | 3 refills | Status: DC

## 2018-01-10 MED FILL — LISINOPRIL 20 MG TABLET: 20 | 30 days supply | Qty: 30 | Fill #1

## 2018-01-10 NOTE — Progress Notes (Signed)
   CC: HTN follow up  HPI:  [name] is a 62 y.o. with PMH of HTN and HLD who presents for follow up of HTN. Patient last seen in clinic 2 weeks ago at which time BP elevated on daily dose of lisinopril 10 mg. Patient's lisinopril increased to 20 mg and told to return to clinic for re-evaluation on new medication dose. History obtained with assistance of translator, as patient's primary language is [place]. Since last visit patient has felt well and has no acute complaints. Patient able to take all medication as previously prescribed each day. Patient has been worried about her blood pressure since last visit because she knows it is important to control it and that it was abnormal when last checked in clinic. Patient denies fevers, headache, chest pain, SOB, and focal weakness.  Past Medical History: Past Medical History:  Diagnosis Date  . Hemorrhoids   . Hyperlipidemia 02/13/2015   Review of Systems:   Patient denies chest pain, shortness of breath, abdominal pain, diaphoresis, nausea/vomiting, lower extremity swelling, and change in bowel/bladder habits.  Physical Exam:  Vitals:   01/10/18 1419  BP: 126/82  Pulse: 87  Temp: 99.2 F (37.3 C)  TempSrc: Oral  SpO2: 99%  Weight: 162 lb 11.2 oz (73.8 kg)  Height:  (1.499 m)   Physical Exam  Constitutional: She appears well-developed and well-nourished. No distress.  HENT:  Mouth/Throat: Oropharynx is clear and moist. No oropharyngeal exudate.  Eyes: Conjunctivae and EOM are normal.  Cardiovascular: Normal rate, regular rhythm and intact distal pulses. Exam reveals no friction rub.  No murmur heard. Respiratory: Effort normal. No respiratory distress. She has no wheezes. She has no rales.  GI: Soft. Bowel sounds are normal. She exhibits no distension. There is no tenderness. There is no rebound.  Musculoskeletal: She exhibits no edema (of bilateral lower extremities) or tenderness (of bilateral lower extremities).    Lymphadenopathy:    She has no cervical adenopathy.  Skin: Skin is warm and dry. No rash noted. She is not diaphoretic. No erythema.  Psychiatric: She has a normal mood and affect. Judgment and thought content normal.   Assessment & Plan:   See Encounters Tab for problem based charting.  Patient discussed with Dr. Heide Spark.

## 2018-01-10 NOTE — Assessment & Plan Note (Addendum)
Patients BP at goal of <130/90 today on daily lisinopril 20 mg. Patient instructed to continue taking this medication daily, refills provided.  Plan: -Lisinopril 20 mg -BMP today to assess renal function given recent increase in lisinopril, will plan to call patient if abnormal

## 2018-01-10 NOTE — Patient Instructions (Addendum)
Thank you for seeing Jeanne Vasquez in the clinic today!  You were evaluated for high blood pressure. Your blood pressure was well controlled during today's visit. We got blood work today. If you do NOT hear from me, that means your blood work is within normal limits and you have nothing to worry about. Please continue taking lisinopril 20 mg daily. I have sent refills to the [facility] for this medication.   Please return to the clinic in 6 months for follow up of your high blood pressure. Please feel free to call the clinic for assessment if you would like to see a physician before this next visit.   If you have any questions or concerns before your next clinic visit, please call our clinic at [phone] between the hours of 9am-5pm. If you have a problem after these hours, please call [phone] and ask for the internal medicine resident on call. If you feel you are having a medical emergency please call 911.   Thanks, Dr. Jeanella Flattery Wandalene Abrams

## 2018-01-11 LAB — BMP8+ANION GAP
Anion Gap: 15 mmol/L (ref 10.0–18.0)
BUN/Creatinine Ratio: 21 (ref 12–28)
BUN: 17 mg/dL (ref 8–27)
CALCIUM: 9.6 mg/dL (ref 8.7–10.3)
CO2: 24 mmol/L (ref 20–29)
CREATININE: 0.81 mg/dL (ref 0.57–1.00)
Chloride: 103 mmol/L (ref 96–106)
GFR calc Af Amer: 90 mL/min/{1.73_m2} (ref >59)
GFR, EST NON AFRICAN AMERICAN: 78 mL/min/{1.73_m2} (ref >59)
Glucose: 84 mg/dL (ref 65–99)
Potassium: 4.7 mmol/L (ref 3.5–5.2)
Sodium: 142 mmol/L (ref 134–144)

## 2018-01-13 NOTE — Progress Notes (Signed)
Internal Medicine Clinic Attending  Case discussed with Dr. Nedrud at the time of the visit.  We reviewed the resident's history and exam and pertinent patient test results.  I agree with the assessment, diagnosis, and plan of care documented in the resident's note.  

## 2018-02-14 MED FILL — LISINOPRIL 20 MG TABLET: 20 | 30 days supply | Qty: 30 | Fill #2

## 2018-02-23 ENCOUNTER — Encounter: Payer: Self-pay | Admitting: *Deleted

## 2018-02-28 MED FILL — LISINOPRIL 20 MG TABLET: 20 | 60 days supply | Qty: 60 | Fill #3

## 2018-04-18 ENCOUNTER — Encounter: Payer: Self-pay | Admitting: Internal Medicine

## 2018-04-18 ENCOUNTER — Ambulatory Visit: Payer: Self-pay

## 2018-09-20 ENCOUNTER — Encounter: Payer: Self-pay | Admitting: Internal Medicine

## 2018-10-05 ENCOUNTER — Ambulatory Visit: Payer: Self-pay | Admitting: Internal Medicine

## 2018-10-05 VITALS — BP 163/79 | HR 80 | Temp 98.4°F | Wt 168.7 lb

## 2018-10-05 DIAGNOSIS — J320 Chronic maxillary sinusitis: Secondary | ICD-10-CM

## 2018-10-05 DIAGNOSIS — Z79899 Other long term (current) drug therapy: Secondary | ICD-10-CM

## 2018-10-05 DIAGNOSIS — I10 Essential (primary) hypertension: Secondary | ICD-10-CM

## 2018-10-05 DIAGNOSIS — J329 Chronic sinusitis, unspecified: Principal | ICD-10-CM

## 2018-10-05 DIAGNOSIS — B9689 Other specified bacterial agents as the cause of diseases classified elsewhere: Secondary | ICD-10-CM | POA: Insufficient documentation

## 2018-10-05 MED ORDER — AMOXICILLIN 875 MG PO TABS: ORAL_TABLET | ORAL | 0 refills | Status: DC

## 2018-10-05 MED ORDER — LISINOPRIL 20 MG PO TABS: 20.0000 mg | ORAL_TABLET | Freq: Every day | ORAL | 3 refills | Status: DC

## 2018-10-05 MED FILL — LISINOPRIL 20 MG TABLET: 20 | 30 days supply | Qty: 30 | Fill #0

## 2018-10-05 MED FILL — AMOXICILLIN 875 MG TABS: 875 | 5 days supply | Qty: 10 | Fill #0

## 2018-10-05 NOTE — Assessment & Plan Note (Signed)
  Nasal congestion: Greater than two weeks of nasal congestion, ear pain and fullness, recurrent symptoms, purulent discharge, maxillary sinus pain and one episode of epistaxis following aggressive cleaning of her nares.   Denied headache, eye pain, pharyngitis, dysphagia, fever, chills, dysphagia, cough. Likely ABRS given her presentation. She may need evaluation by ENT if amenable as this occurs nearly annually but not to this degree. I feel she is at increased risk for ABRS/AVRS given the narrowness of her nasal passages, although this may be 2/2 to acute edema.  Plan:  Amoxicillin 875 BID x 5 days Given return precautions Attempted to explain nasal rinsing, please reinforce this at next visit.

## 2018-10-05 NOTE — Progress Notes (Signed)
   CC: sinus congestion  HPI:Ms.Miguel Marcos is a 63 y.o. female who presents for evaluation of sinus congestion and need for blood pressure medication refill. Please see individual problem based A/P for details.  Past Medical History:  Diagnosis Date  . Hemorrhoids   . Hyperlipidemia 02/13/2015   Review of Systems:  ROS negative except as per HPI.  Physical Exam: Vitals:   10/05/18 1456  BP: (!) 163/79  Pulse: 80  Temp: 98.4 F (36.9 C)  TempSrc: Oral  SpO2: 100%  Weight: 168 lb 11.2 oz (76.5 kg)   General: A/O x4, in no acute distress, afebrile, nondiaphoretic HEENT: Nares erythematous and edematous, purulent material noted. No masses or lesions noted Tympanic membrane with cone of light noted absent fluid level. Pulmonary: CTA bilaterally, no wheezing or crackles  Assessment & Plan:   See Encounters Tab for problem based charting.  Patient discussed with Dr. Josem Kaufmann

## 2018-10-05 NOTE — Assessment & Plan Note (Addendum)
HTN: Off of meds for months as she did not know what she was supposed to do. She denied known symptoms from the medication. I reminded her to call us or stop by if she has any questions.  Plan: Resumed lisinopril 20mg  daily today Return in four weeks for BP check and BMP, she will likely need at least two medications given her BP. Would consider Amlodipine at her next visit as this is easier to manage  BMP is ordered

## 2018-10-05 NOTE — Patient Instructions (Signed)
FOLLOW-UP INSTRUCTIONS When: If your symptoms worsen or fail to improve What to bring: All of your medications  I have provided a script for antibiotics. Please take two tablets daily, one in the morning and one late evening until they are gone. Please do not stop taking them just because you may feel better.  As always if your symptoms worsen, fail to improve, or you develop other concerning symptoms, please notify our office or visit the local ER if we are unavailable. Symptoms including fever, chills, visual changes, or worsening pain, should not be ignored and should encourage you to visit the ED if we are unavailable by phone or the symptoms are severe.  Thank you for your visit to the [facility] today. If you have any questions or concerns please call us at [phone].

## 2018-10-05 NOTE — Progress Notes (Signed)
Case discussed with Dr. Harbrecht at time of visit.  We reviewed the resident's history and exam and pertinent patient test results.  I agree with the assessment, diagnosis, and plan of care documented in the resident's note. 

## 2018-10-17 ENCOUNTER — Ambulatory Visit: Payer: Self-pay

## 2018-10-31 ENCOUNTER — Encounter: Payer: Self-pay | Admitting: Internal Medicine

## 2018-10-31 ENCOUNTER — Other Ambulatory Visit: Payer: Self-pay

## 2018-10-31 ENCOUNTER — Ambulatory Visit (INDEPENDENT_AMBULATORY_CARE_PROVIDER_SITE_OTHER): Payer: Self-pay | Admitting: Internal Medicine

## 2018-10-31 VITALS — BP 147/96 | HR 73 | Temp 98.2°F | Wt 166.0 lb

## 2018-10-31 DIAGNOSIS — M25511 Pain in right shoulder: Secondary | ICD-10-CM | POA: Insufficient documentation

## 2018-10-31 DIAGNOSIS — I10 Essential (primary) hypertension: Secondary | ICD-10-CM

## 2018-10-31 DIAGNOSIS — Z79899 Other long term (current) drug therapy: Secondary | ICD-10-CM

## 2018-10-31 DIAGNOSIS — K0889 Other specified disorders of teeth and supporting structures: Secondary | ICD-10-CM

## 2018-10-31 MED ORDER — NAPROXEN 500 MG PO TABS: 500.0000 mg | ORAL_TABLET | Freq: Two times a day (BID) | ORAL | 0 refills | Status: DC

## 2018-10-31 MED ORDER — AMLODIPINE BESYLATE 5 MG PO TABS: 5.0000 mg | ORAL_TABLET | Freq: Every day | ORAL | 11 refills | Status: DC

## 2018-10-31 MED FILL — LISINOPRIL 20 MG TABLET: 20 | 30 days supply | Qty: 30 | Fill #1

## 2018-10-31 MED FILL — AMLODIPINE BESYLATE 5 MG TA: 5 | 30 days supply | Qty: 30 | Fill #0

## 2018-10-31 MED FILL — NAPROXEN 500 MG TABLET: 500 | 15 days supply | Qty: 30 | Fill #0

## 2018-10-31 NOTE — Patient Instructions (Signed)
For your blood pressure: Continue lisinopril 20mg  daily Start also taking amlodipine 5mg  daily You can get these at the [facility] outpatient pharmacy next to [facility]  For your shoulder pain, it looks like you have a muscle sprain Use naproxen 500mg  twice daily for the next week or so. Use Capsaicin creme (over the counter) Use heat throughout the day Gently use the shoulder throughout the day to keep it moving. When you sleep, make sure to give your arm and neck good support and lie on your back for now

## 2018-10-31 NOTE — Assessment & Plan Note (Addendum)
Patient restarted on lisinopril about 3 weeks ago with some improvement in BP, however still uncontrolled. She is asymptomatic.  Plan: --continue lisinopril 20mg  daily; advised she has refills at [facility] outpt pharmacy as she was using June 2019 script --start amlodipine 5mg  daily --f/u Bmet --f/u in 1 month for BP check  Addendum: Bmet wnl today

## 2018-10-31 NOTE — Assessment & Plan Note (Signed)
Patient mentioned to RN on way out that she has tooth pain worse with chewing and would like a referral to dentistry. I did not get to evaluate this problem today. Referral to dentistry placed.

## 2018-10-31 NOTE — Progress Notes (Signed)
   CC: hypertension  HPI:  [name] is a 63 y.o. with a PMH of hypertension presenting to clinic for follow up on hypertension and evaluation of right shoulder pain.  Per patient preference, her son acted as Equities trader; she was offered Theatre stage manager interpreter, however she declined this.  HTN: Patient endorses taking lisinopril 20mg  daily at night every day since last visit. She has not checked her blood pressure. She denies chest pain, shortness of breath, headaches, vision or hearing changes, lower extremity swelling.  Right shoulder pain: Patient endorses about a 5d history of right shoulder pain; she doesn't remember the circumstances under which she noticed the pain begin. She has had pain like this before on two prior occasions which quickly resolved with ibuprofen; initial pain previously was after a fall in a grocery store. 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 She denies associated weakness, numbness/tingling, swelling. She localizes the pain to the anterior shoulder; pain is exacerbated with movement of the shoulder and is especially worse at night keeping her from sleeping.   Please see problem based Assessment and Plan for status of patients chronic conditions.  Past Medical History:  Diagnosis Date  . Hemorrhoids   . Hyperlipidemia 02/13/2015    Review of Systems:   Per HPI  Physical Exam:  Vitals:   10/31/18 1419  BP: (!) 161/95  Pulse: 75  Temp: 98.2 F (36.8 C)  TempSrc: Oral  SpO2: 97%  Weight: 166 lb (75.3 kg)   GENERAL- alert, co-operative, appears as stated age, not in any distress. CARDIAC- RRR, no murmurs, rubs or gallops. RESP- Moving equal volumes of air, and clear to auscultation bilaterally, no wheezes or crackles. ABDOMEN- Soft, nontender, bowel sounds present. NEURO- Sensation intact in bil UEs. EXTREMITIES-  pulse 2+ PT, symmetric, no pedal edema. TTP of anterior right shoulder; no effusion or erythema noted. Full passive range of motion. Active range of motion revealed pain with external rotation, internal rotation, flexion, and empty can test.  SKIN- Warm, dry, no rash or lesion on right shoulder  Assessment & Plan:   See Encounters Tab for problem based charting.   Patient discussed with Dr. Cleda Daub   Nyra Market, MD Internal Medicine PGY-3

## 2018-10-31 NOTE — Assessment & Plan Note (Signed)
Patient with 5d h/o anterior shoulder pain with symptoms and exam consistent with tendinitis.  Plan: --naproxen 500mg  BID --advised use of capsaicin --advised gentle ROM exercises to help prevent frozen shoulder as she has been limiting use of her arm --advised use of heat --if pain continues at f/u, can consider xray and u/s

## 2018-11-01 LAB — BMP8+ANION GAP
Anion Gap: 17 mmol/L (ref 10.0–18.0)
BUN / CREAT RATIO: 22 (ref 12–28)
BUN: 16 mg/dL (ref 8–27)
CO2: 20 mmol/L (ref 20–29)
CREATININE: 0.73 mg/dL (ref 0.57–1.00)
Calcium: 9.4 mg/dL (ref 8.7–10.3)
Chloride: 104 mmol/L (ref 96–106)
GFR calc non Af Amer: 88 mL/min/{1.73_m2} (ref >59)
GFR, EST AFRICAN AMERICAN: 101 mL/min/{1.73_m2} (ref >59)
Glucose: 86 mg/dL (ref 65–99)
Potassium: 4.2 mmol/L (ref 3.5–5.2)
SODIUM: 141 mmol/L (ref 134–144)

## 2018-11-01 NOTE — Progress Notes (Signed)
Internal Medicine Clinic Attending  Case discussed with Dr. Svalina  at the time of the visit.  We reviewed the resident's history and exam and pertinent patient test results.  I agree with the assessment, diagnosis, and plan of care documented in the resident's note.  

## 2018-11-21 MED FILL — AMLODIPINE BESYLATE 5 MG TA: 5 | 30 days supply | Qty: 30 | Fill #1 | Status: TO

## 2018-11-21 MED FILL — LISINOPRIL 20 MG TABLET: 20 | 30 days supply | Qty: 30 | Fill #2 | Status: TO

## 2018-11-28 ENCOUNTER — Ambulatory Visit: Payer: Self-pay

## 2018-12-29 ENCOUNTER — Other Ambulatory Visit: Payer: Self-pay

## 2018-12-29 NOTE — Telephone Encounter (Signed)
Spoke to [facility] in [place], she will fill, call pt and discuss pick up or mail

## 2018-12-29 NOTE — Telephone Encounter (Signed)
amLODipine (NORVASC) 5 MG tablet   lisinopril (PRINIVIL,ZESTRIL) 20 MG tablet   Refill request @ [facility].

## 2019-01-09 ENCOUNTER — Other Ambulatory Visit: Payer: Self-pay | Admitting: *Deleted

## 2019-01-09 DIAGNOSIS — I10 Essential (primary) hypertension: Secondary | ICD-10-CM

## 2019-01-09 MED ORDER — AMLODIPINE BESYLATE 5 MG PO TABS: 5.0000 mg | ORAL_TABLET | Freq: Every day | ORAL | 11 refills | Status: DC

## 2019-01-09 MED ORDER — LISINOPRIL 20 MG PO TABS: 20.0000 mg | ORAL_TABLET | Freq: Every day | ORAL | 3 refills | Status: DC

## 2019-01-09 MED FILL — LISINOPRIL 20 MG TABLET: 20 | 30 days supply | Qty: 30 | Fill #0

## 2019-01-09 MED FILL — AMLODIPINE BESYLATE 5 MG TA: 5 | 30 days supply | Qty: 30 | Fill #0

## 2019-01-09 NOTE — Telephone Encounter (Signed)
Received call from patient's husband that patient is completely out of these meds. Have sent e-mail to [facility] with that in the subject line. Kinnie Feil, RN, BSN

## 2019-01-09 NOTE — Telephone Encounter (Signed)
Received e-mail from [facility] that they are processing these refills at present. Patient's husband notified to call the pharmacy at [phone] to verify address and payment info. Kinnie Feil, RN, BSN

## 2019-03-06 ENCOUNTER — Other Ambulatory Visit: Payer: Self-pay

## 2019-03-06 DIAGNOSIS — I10 Essential (primary) hypertension: Secondary | ICD-10-CM

## 2019-03-06 MED FILL — AMLODIPINE BESYLATE 5 MG TA: 5 | 30 days supply | Qty: 30 | Fill #0

## 2019-03-06 MED FILL — LISINOPRIL 20 MG TABLET: 20 | 30 days supply | Qty: 30 | Fill #0

## 2019-03-06 NOTE — Telephone Encounter (Signed)
Pt has refills, called [facility], they will transfer back from [place] and call pt

## 2019-03-06 NOTE — Telephone Encounter (Signed)
amLODipine (NORVASC) 5 MG tablet,  lisinopril (ZESTRIL) 20 MG tablet, REFILL REQUEST @ [facility].

## 2019-03-14 NOTE — Progress Notes (Signed)
CC: Hypertension and right knee pain  HPI:  Jeanne Vasquez is a 63 y.o.  with a PMH listed below, cambodian speaking, translator was used, presenting for hypertension and right knee pain.  She states that about 1 week ago she started having right knee pain, that is throbbing in nature, comes and goes, no radiation, seems to be worse at night.  She has not tried anything for this, had been on naproxen previously for her right shoulder pain and is requesting to try this.  She denied any fevers, chills, swelling, redness, warmth, or other joint pains that she noted.  She does report a history of falls and last year she had an episode where she had a mechanical fall up her stairs and fell on both of her knees. Patient was also requesting refills of her medications, she states that she has been out of her lisinopril and amlodipine for about a week now and had not been taking anything.  She denied any issues when taking her medications.  Please see A&P for status of the patient's chronic medical conditions  Past Medical History:  Diagnosis Date  . Hemorrhoids   . Hyperlipidemia 02/13/2015   Review of Systems: Refer to history of present illness and assessment and plans for pertinent review of systems, all others reviewed and negative.  Physical Exam:  Vitals:   03/15/19 1425  BP: (!) 141/84  Pulse: 72  Temp: 98.2 F (36.8 C)  TempSrc: Oral  SpO2: 99%  Weight: 164 lb (74.4 kg)    Physical Exam  Constitutional: She is oriented to person, place, and time and well-developed, well-nourished, and in no distress.  HENT:  Head: Normocephalic and atraumatic.  Eyes: Pupils are equal, round, and reactive to light. Conjunctivae and EOM are normal.  Cardiovascular: Normal rate and regular rhythm.  Pulmonary/Chest: Breath sounds normal. No respiratory distress.  Abdominal: Soft. Bowel sounds are normal. She exhibits no distension.  Musculoskeletal: Normal range of motion.     Comments: Right knee: No  erythema, edema, warmth, or discoloration noted, minimal TTP over medial aspect of posterior knee, normal ROM.  No other obvious joint deformities.  Neurological: She is alert and oriented to person, place, and time.  Skin: Skin is warm and dry.  Psychiatric: Mood and affect normal.    Social History   Socioeconomic History  . Marital status: Single    Spouse name: Not on file  . Number of children: Not on file  . Years of education: 0  . Highest education level: Not on file  Occupational History  . Occupation: Unemployed  Social Needs  . Financial resource strain: Not on file  . Food insecurity    Worry: Not on file    Inability: Not on file  . Transportation needs    Medical: Not on file    Non-medical: Not on file  Tobacco Use  . Smoking status: Never Smoker  . Smokeless tobacco: Never Used  Substance and Sexual Activity  . Alcohol use: No    Alcohol/week: 0.0 standard drinks  . Drug use: No  . Sexual activity: Not on file  Lifestyle  . Physical activity    Days per week: Not on file    Minutes per session: Not on file  . Stress: Not on file  Relationships  . Social Musicianconnections    Talks on phone: Not on file    Gets together: Not on file    Attends religious service: Not on file  Active member of club or organization: Not on file    Attends meetings of clubs or organizations: Not on file    Relationship status: Not on file  . Intimate partner violence    Fear of current or ex partner: Not on file    Emotionally abused: Not on file    Physically abused: Not on file    Forced sexual activity: Not on file  Other Topics Concern  . Not on file  Social History Narrative   Came from [place] by herself. She has 4 children.    Family History  Problem Relation Age of Onset  . Cancer Neg Hx   . Heart disease Neg Hx   . Hypertension Neg Hx   . Colon cancer Neg Hx     Assessment & Plan:   See Encounters Tab for problem based charting.  Patient discussed  with Dr. Angelia Mould

## 2019-03-15 ENCOUNTER — Other Ambulatory Visit: Payer: Self-pay

## 2019-03-15 ENCOUNTER — Encounter: Payer: Self-pay | Admitting: Internal Medicine

## 2019-03-15 ENCOUNTER — Ambulatory Visit (INDEPENDENT_AMBULATORY_CARE_PROVIDER_SITE_OTHER): Payer: Self-pay | Admitting: Internal Medicine

## 2019-03-15 VITALS — BP 141/84 | HR 72 | Temp 98.2°F | Wt 164.0 lb

## 2019-03-15 DIAGNOSIS — I10 Essential (primary) hypertension: Secondary | ICD-10-CM

## 2019-03-15 DIAGNOSIS — Z79899 Other long term (current) drug therapy: Secondary | ICD-10-CM

## 2019-03-15 DIAGNOSIS — Z0000 Encounter for general adult medical examination without abnormal findings: Secondary | ICD-10-CM

## 2019-03-15 DIAGNOSIS — Z9181 History of falling: Secondary | ICD-10-CM

## 2019-03-15 DIAGNOSIS — M25561 Pain in right knee: Secondary | ICD-10-CM

## 2019-03-15 MED ORDER — NAPROXEN 500 MG PO TABS: 500.0000 mg | ORAL_TABLET | Freq: Two times a day (BID) | ORAL | 0 refills | Status: DC

## 2019-03-15 MED ORDER — LISINOPRIL 20 MG PO TABS: 20.0000 mg | ORAL_TABLET | Freq: Every day | ORAL | 3 refills | Status: DC

## 2019-03-15 MED ORDER — AMLODIPINE BESYLATE 5 MG PO TABS: 5.0000 mg | ORAL_TABLET | Freq: Every day | ORAL | 11 refills | Status: DC

## 2019-03-15 MED FILL — AMLODIPINE BESYLATE 5 MG TA: 5 | 30 days supply | Qty: 30 | Fill #0

## 2019-03-15 MED FILL — LISINOPRIL 20 MG TABLET: 20 | 90 days supply | Qty: 90 | Fill #0

## 2019-03-15 MED FILL — NAPROXEN 500 MG TABLET: 500 | 15 days supply | Qty: 30 | Fill #0

## 2019-03-15 NOTE — Patient Instructions (Addendum)
Ms. Jaskiran Pata,  It was a pleasure to see you today. Thank you for coming in.   Today we discussed your blood pressure and right knee pain.   Your blood pressure was a little high today, please restart taking both of your blood pressure medications.   For your right knee pain, please start taking the naproxen daily for the next 2 weeks.  Please return to clinic in 3 months or sooner if needed.   Thank you again for coming in.   Lonia Skinner M.D.  ??????? Minha Alred,  ?????????????????????????????????????????? ????????????????????  ????????????????????????????????????????????????????????????  ???????????????????????????????????????????????????????????????????????????????????????????  ????????????????????????????????????????????????????? naproxen ??????????????????? ? ???????????????  ???????????????????????????????? ? ????????????????????????  ???????????????????????????  ?????????????????? ???

## 2019-03-16 DIAGNOSIS — M25561 Pain in right knee: Secondary | ICD-10-CM

## 2019-03-16 HISTORY — DX: Pain in right knee: M25.561

## 2019-03-16 NOTE — Assessment & Plan Note (Addendum)
Patient is supposed to be on lisinopril 20 mg daily and amlodipine 5 mg daily, she states that she ran out of her medications about a week ago and needs refills.  She denied any issues with her medications when she was taking them. Denied any lightheadedness, dizziness, fatigue, weakness, cough, or other symptoms.  Her blood pressure today was 141/84.  Last BMP was unremarkable.   Plan: -Refill lisinopril 20 mg daily -Refill amlodipine 5 mg daily -Continue to monitor

## 2019-03-16 NOTE — Assessment & Plan Note (Signed)
She states that about 1 week ago she started having right knee pain, that is throbbing in nature, comes and goes, no radiation, seems to be worse at night.  She has not tried anything for this, had been on naproxen previously for her right shoulder pain and is requesting to try this.  She denied any fevers, chills, swelling, redness, warmth, or other joint pains that she noted.  She does report a history of falls and last year she had an episode where she had a mechanical fall up her stairs and fell on both of her knees. On exam she has no erythema, edema, swelling, or warmth noted, normal range of motion, mild tenderness in the medial aspect of the posterior knee.  Given her history of mild trauma could be related to musculoskeletal issue.  Recommended that we trial her on the naproxen and monitor for now and patient was in agreement.  Plan: -Naproxen for 2 weeks -Advised patient to use warm compresses -If symptoms worsen or has no improvement can consider imaging

## 2019-03-16 NOTE — Assessment & Plan Note (Signed)
Ordered mammogram, patient given information on scholarship for mammogram

## 2019-03-21 NOTE — Progress Notes (Signed)
Internal Medicine Clinic Attending  Case discussed with Dr. Krienke at the time of the visit.  We reviewed the resident's history and exam and pertinent patient test results.  I agree with the assessment, diagnosis, and plan of care documented in the resident's note.    

## 2019-04-24 MED FILL — AMLODIPINE BESYLATE 5 MG TA: 5 | 30 days supply | Qty: 30 | Fill #1

## 2019-05-22 MED FILL — LISINOPRIL 20 MG TABLET: 20 | 30 days supply | Qty: 30 | Fill #0

## 2019-05-22 MED FILL — AMLODIPINE BESYLATE 5 MG TA: 5 | 30 days supply | Qty: 30 | Fill #0

## 2019-06-26 ENCOUNTER — Encounter: Payer: Self-pay | Admitting: Internal Medicine

## 2019-06-26 MED FILL — AMLODIPINE BESYLATE 5 MG TA: 5 | 30 days supply | Qty: 30 | Fill #1

## 2019-06-26 MED FILL — LISINOPRIL 20 MG TABLET: 20 | 30 days supply | Qty: 30 | Fill #1

## 2019-06-26 NOTE — Progress Notes (Deleted)
   CC: ***  HPI:  Ms.Gladys Montville is a 63 y.o.  with a PMH listed below presenting for ***   Hypertension: Paatietn is currently on lisinopril 20 mg daily and amlodipine 5 mg daily. Today her BP is ***.   Please see A&P for status of the patient's chronic medical conditions  Past Medical History:  Diagnosis Date  . Hemorrhoids   . Hyperlipidemia 02/13/2015   Review of Systems: Refer to history of present illness and assessment and plans for pertinent review of systems, all others reviewed and negative.  Physical Exam:  There were no vitals filed for this visit. *** Physical Exam  Social History   Socioeconomic History  . Marital status: Single    Spouse name: Not on file  . Number of children: Not on file  . Years of education: 0  . Highest education level: Not on file  Occupational History  . Occupation: Unemployed  Social Needs  . Financial resource strain: Not on file  . Food insecurity    Worry: Not on file    Inability: Not on file  . Transportation needs    Medical: Not on file    Non-medical: Not on file  Tobacco Use  . Smoking status: Never Smoker  . Smokeless tobacco: Never Used  Substance and Sexual Activity  . Alcohol use: No    Alcohol/week: 0.0 standard drinks  . Drug use: No  . Sexual activity: Not on file  Lifestyle  . Physical activity    Days per week: Not on file    Minutes per session: Not on file  . Stress: Not on file  Relationships  . Social Herbalist on phone: Not on file    Gets together: Not on file    Attends religious service: Not on file    Active member of club or organization: Not on file    Attends meetings of clubs or organizations: Not on file    Relationship status: Not on file  . Intimate partner violence    Fear of current or ex partner: Not on file    Emotionally abused: Not on file    Physically abused: Not on file    Forced sexual activity: Not on file  Other Topics Concern  . Not on file  Social History  Narrative   Came from [place] by herself. She has 4 children.   *** Family History  Problem Relation Age of Onset  . Cancer Neg Hx   . Heart disease Neg Hx   . Hypertension Neg Hx   . Colon cancer Neg Hx     Assessment & Plan:   See Encounters Tab for problem based charting.  Patient {GC/GE:[id number]::"discussed with","seen with"} Dr. {NAMES:[id number]::"Butcher","Granfortuna","E. Hoffman","Klima","Mullen","Narendra","Raines","Vincent"}

## 2019-08-07 MED FILL — AMLODIPINE BESYLATE 5 MG TA: 5 | 30 days supply | Qty: 30 | Fill #2

## 2019-08-07 MED FILL — LISINOPRIL 20 MG TABLET: 20 | 30 days supply | Qty: 30 | Fill #2

## 2019-10-02 MED FILL — AMLODIPINE BESYLATE 5 MG TA: 5 | 30 days supply | Qty: 30 | Fill #3

## 2019-10-02 MED FILL — LISINOPRIL 20 MG TABLET: 20 | 30 days supply | Qty: 30 | Fill #3

## 2019-11-13 MED FILL — AMLODIPINE BESYLATE 5 MG TA: 5 | 30 days supply | Qty: 30 | Fill #4

## 2019-11-13 MED FILL — LISINOPRIL 20 MG TABLET: 20 | 30 days supply | Qty: 30 | Fill #4

## 2019-11-24 NOTE — Progress Notes (Signed)
CC: Shoulder pain, hypertension, nosebleeds and abdominal discomfort  HPI:  [name] is a 64 y.o.  with a PMH listed below presenting for shoulder pain, hypertension, nosebleeds and abdominal discomfort.   Please see A&P for status of the patient's chronic medical conditions  Past Medical History:  Diagnosis Date  . Hemorrhoids   . Hyperlipidemia 02/13/2015   Review of Systems: Refer to history of present illness and assessment and plans for pertinent review of systems, all others reviewed and negative.  Physical Exam:  Vitals:   11/27/19 1526  BP: 130/85  Pulse: 98  Temp: 98.4 F (36.9 C)  SpO2: 99%  Weight: 167 lb 4.8 oz (75.9 kg)   Physical Exam  Constitutional: She is oriented to person, place, and time and well-developed, well-nourished, and in no distress.  HENT:  Head: Normocephalic and atraumatic.  Neck: Tracheal deviation: TTP in RLQ area, small 2-3 cm soft nodule noted in that area.  Cardiovascular: Normal rate, regular rhythm and normal heart sounds.  Pulmonary/Chest: Effort normal and breath sounds normal.  Abdominal: Soft. Bowel sounds are normal. She exhibits no mass. There is abdominal tenderness (TTP in RLQ area, small 2-3 cm soft nodule noted in that area ). There is no rebound and no guarding.  Musculoskeletal:     Cervical back: Normal range of motion and neck supple.     Comments: Right shoulder pain with active and passive movement, no warmth or edema noted  Neurological: She is alert and oriented to person, place, and time.  Skin: Skin is warm and dry. No erythema.  Psychiatric: Mood and affect normal.    Social History   Socioeconomic History  . Marital status: Single    Spouse name: Not on file  . Number of children: Not on file  . Years of education: 0  . Highest education level: Not on file  Occupational History  . Occupation: Unemployed  Tobacco Use  . Smoking status: Never Smoker  . Smokeless tobacco: Never Used  Substance and  Sexual Activity  . Alcohol use: No    Alcohol/week: 0.0 standard drinks  . Drug use: No  . Sexual activity: Not on file  Other Topics Concern  . Not on file  Social History Narrative   Came from [place] by herself. She has 4 children.   Social Determinants of Health   Financial Resource Strain:   . Difficulty of Paying Living Expenses:   Food Insecurity:   . Worried About Charity fundraiser in the Last Year:   . Arboriculturist in the Last Year:   Transportation Needs:   . Film/video editor (Medical):   Marland Kitchen Lack of Transportation (Non-Medical):   Physical Activity:   . Days of Exercise per Week:   . Minutes of Exercise per Session:   Stress:   . Feeling of Stress :   Social Connections:   . Frequency of Communication with Friends and Family:   . Frequency of Social Gatherings with Friends and Family:   . Attends Religious Services:   . Active Member of Clubs or Organizations:   . Attends Archivist Meetings:   Marland Kitchen Marital Status:   Intimate Partner Violence:   . Fear of Current or Ex-Partner:   . Emotionally Abused:   Marland Kitchen Physically Abused:   . Sexually Abused:    Family History  Problem Relation Age of Onset  . Cancer Neg Hx   . Heart disease Neg Hx   . Hypertension  Neg Hx   . Colon cancer Neg Hx     Assessment & Plan:   See Encounters Tab for problem based charting.  Patient discussed with Dr. Sandre Kitty

## 2019-11-27 ENCOUNTER — Encounter: Payer: Self-pay | Admitting: Internal Medicine

## 2019-11-27 ENCOUNTER — Other Ambulatory Visit: Payer: Self-pay

## 2019-11-27 ENCOUNTER — Ambulatory Visit (INDEPENDENT_AMBULATORY_CARE_PROVIDER_SITE_OTHER): Payer: Self-pay | Admitting: Internal Medicine

## 2019-11-27 VITALS — BP 130/85 | HR 98 | Temp 98.4°F | Ht 59.0 in | Wt 167.3 lb

## 2019-11-27 DIAGNOSIS — R040 Epistaxis: Secondary | ICD-10-CM

## 2019-11-27 DIAGNOSIS — Z79899 Other long term (current) drug therapy: Secondary | ICD-10-CM

## 2019-11-27 DIAGNOSIS — I10 Essential (primary) hypertension: Secondary | ICD-10-CM

## 2019-11-27 DIAGNOSIS — R1031 Right lower quadrant pain: Secondary | ICD-10-CM

## 2019-11-27 DIAGNOSIS — R222 Localized swelling, mass and lump, trunk: Secondary | ICD-10-CM

## 2019-11-27 DIAGNOSIS — M25511 Pain in right shoulder: Secondary | ICD-10-CM

## 2019-11-27 DIAGNOSIS — M25561 Pain in right knee: Secondary | ICD-10-CM

## 2019-11-27 MED ORDER — CETIRIZINE HCL 10 MG PO TABS: 10.0000 mg | ORAL_TABLET | Freq: Every day | ORAL | 2 refills | Status: DC

## 2019-11-27 MED ORDER — NAPROXEN 500 MG PO TABS: 500.0000 mg | ORAL_TABLET | Freq: Two times a day (BID) | ORAL | 0 refills | Status: DC

## 2019-11-27 NOTE — Patient Instructions (Addendum)
[name],  It was a pleasure to see you today. Thank you for coming in.   Today we discussed your nose bleeds. Please continue using the same technique to stop the bleeding. Please try to avoid blowing your nose or picking at it. Your allergies may be contributing to this, please restart taking Zyrtec daily to see if this helps. If you continue to have issues please let us know and we can refer you to the Ear, Nose and Throat doctors.   In regards to your shoulder pain, you can continue using the Naproxen for now. I have placed a referral for the Sports medicine doctors.   In regards to your abdominal pain, I am checking some labs and will contact you with the results. Please continue eating a well balanced diet.   Please return to clinic in 3 months or sooner if needed.   Thank you again for coming in.   Claudean Severance.D.

## 2019-11-28 DIAGNOSIS — R109 Unspecified abdominal pain: Secondary | ICD-10-CM | POA: Insufficient documentation

## 2019-11-28 DIAGNOSIS — R040 Epistaxis: Secondary | ICD-10-CM | POA: Insufficient documentation

## 2019-11-28 LAB — URINALYSIS, ROUTINE W REFLEX MICROSCOPIC
Bilirubin, UA: NEGATIVE
Glucose, UA: NEGATIVE
Ketones, UA: NEGATIVE
Leukocytes,UA: NEGATIVE
Nitrite, UA: NEGATIVE
Protein,UA: NEGATIVE
RBC, UA: NEGATIVE
Specific Gravity, UA: 1.01 (ref 1.005–1.030)
Urobilinogen, Ur: 0.2 mg/dL (ref 0.2–1.0)
pH, UA: 6 (ref 5.0–7.5)

## 2019-11-28 NOTE — Assessment & Plan Note (Signed)
Patient reports having nosebleeds since last Sunday, first episode lasted about 40 minutes, reported bleeding from the right side, she and her son applied pressure and leaned forward with resolution of the nosebleed.  She has had it about 4-5 times since then, each episode lasting about 10 to 15 minutes.  She reports that her dog had jumped in her on the face about a week prior to this, denies any other trauma.  She denies any lightheadedness, dizziness, fatigue, weakness, headaches, chest pain, shortness of breath, or other symptoms.  Denies any excessive bruising or other sources of bleeding such as from the gums or eye area.  She has never had this before.  She does report seasonal allergies in used to take Zyrtec however has not been taking anything recently, reports having mildly itchy nose.  On exam there is no significant trauma or abnormalities noted, does seem to be a scar on the medial aspect of her right nasal passage with no active bleeding.  Overall discussed that this could be related to the mild trauma that she had from the dog or could be related to her allergies.  Discussed that since she is not actively bleeding and no obvious source of bleeding is identified then we can monitor for now and if she starts developing worsening symptoms systemic symptoms then can refer her to ENT for further evaluation.  -Start Zyrtec daily -Continue to monitor and if bleeding is persistent will obtain labs and refer to ENT

## 2019-11-28 NOTE — Assessment & Plan Note (Signed)
Patient reports that she been having abdominal discomfort for the past 2 weeks, located on her right lower quadrant area, no radiation, not particularly painful however reports that it is not comfortable.  Reports that bowel movements sometimes helps.  Denies ever having this issue before.  Does report having some around the vaginal area after urination, denies any discharge, hematuria, or dysuria.  Denies any fevers, chills, nausea, vomiting, diarrhea or constipation.  On exam she does have tenderness to palpation in the right lower quadrant area, small, soft, approximately 2-3 cm nodule is felt around that area, bowel sounds are normal, and no acute abdomen noted.  Will obtain urinalysis to evaluate for UTI given the genital heat that she noted. This could be related to a small lipoma in the RLQ area, will have close follow up to monitor for any changes in size.   -U/A -RTC in 3 months to re-evaluate

## 2019-11-28 NOTE — Assessment & Plan Note (Addendum)
Patient continues to endorse some right shoulder pain that is worse with movement and activity.  She has been having this intermittently for about 1 year and has been given naproxen for the pain.  Patient reports improvement with that and is requesting a refill.  On exam she does have some pain with active and passive movement.  Still seems to be consistent with tendinitis.  Discussed refilling naproxen and referral to sports medicine for further evaluation. -Refill naproxen 500 mg twice daily -Sports medicine referral

## 2019-11-28 NOTE — Assessment & Plan Note (Signed)
Patient is currently on lisinopril 20 mg daily and amlodipine 5 mg daily.  Denies any issues taking her medications.  Blood pressure today is 130/85.  Continue current regimen.

## 2019-11-28 NOTE — Progress Notes (Signed)
Internal Medicine Clinic Attending  Case discussed with Dr. Krienke at the time of the visit.  We reviewed the resident's history and exam and pertinent patient test results.  I agree with the assessment, diagnosis, and plan of care documented in the resident's note.  Trinia Georgi, M.D., Ph.D.  

## 2019-12-25 ENCOUNTER — Ambulatory Visit: Payer: Self-pay

## 2019-12-25 MED FILL — LISINOPRIL 20 MG TABLET: 20 | 30 days supply | Qty: 30 | Fill #5

## 2019-12-25 MED FILL — AMLODIPINE BESYLATE 5 MG TA: 5 | 30 days supply | Qty: 30 | Fill #5

## 2020-01-24 NOTE — Addendum Note (Signed)
Addended by: Dorie Rank E on: 01/24/2020 07:00 PM   Modules accepted: Orders

## 2020-01-29 MED FILL — AMLODIPINE BESYLATE 5 MG TA: 5 | 30 days supply | Qty: 30 | Fill #6

## 2020-01-29 MED FILL — LISINOPRIL 20 MG TABLET: 20 | 30 days supply | Qty: 30 | Fill #6

## 2020-02-25 NOTE — Progress Notes (Deleted)
   CC: ***  HPI:  Ms.Wende Suhre is a 64 y.o.     Past Medical History:  Diagnosis Date  . Hemorrhoids   . Hyperlipidemia 02/13/2015   Review of Systems:  ***  Physical Exam:  There were no vitals filed for this visit. ***  Assessment & Plan:   See Encounters Tab for problem based charting.  Patient discussed with Dr. {NAMES:[id number]::"Butcher","Guilloud","Hoffman","Mullen","Narendra","Raines","Vincent"}

## 2020-02-26 ENCOUNTER — Encounter: Payer: Self-pay | Admitting: Internal Medicine

## 2020-03-18 ENCOUNTER — Other Ambulatory Visit: Payer: Self-pay | Admitting: Internal Medicine

## 2020-03-18 DIAGNOSIS — I10 Essential (primary) hypertension: Secondary | ICD-10-CM

## 2020-03-18 MED FILL — LISINOPRIL 20 MG TABLET: 20 | 30 days supply | Qty: 30 | Fill #0

## 2020-03-18 MED FILL — AMLODIPINE BESYLATE 5 MG TA: 5 | 30 days supply | Qty: 30 | Fill #0

## 2020-05-06 MED FILL — LISINOPRIL 20 MG TABLET: 20 | 30 days supply | Qty: 30 | Fill #1

## 2020-05-06 MED FILL — AMLODIPINE BESYLATE 5 MG TA: 5 | 30 days supply | Qty: 30 | Fill #1

## 2020-05-09 ENCOUNTER — Other Ambulatory Visit: Payer: Self-pay

## 2020-05-09 ENCOUNTER — Ambulatory Visit (INDEPENDENT_AMBULATORY_CARE_PROVIDER_SITE_OTHER): Payer: Self-pay | Admitting: Internal Medicine

## 2020-05-09 ENCOUNTER — Encounter: Payer: Self-pay | Admitting: Internal Medicine

## 2020-05-09 VITALS — BP 125/84 | HR 85 | Temp 98.1°F | Ht 59.0 in | Wt 166.6 lb

## 2020-05-09 DIAGNOSIS — R040 Epistaxis: Secondary | ICD-10-CM

## 2020-05-09 DIAGNOSIS — I10 Essential (primary) hypertension: Secondary | ICD-10-CM

## 2020-05-09 DIAGNOSIS — Z0000 Encounter for general adult medical examination without abnormal findings: Secondary | ICD-10-CM

## 2020-05-09 DIAGNOSIS — R1031 Right lower quadrant pain: Secondary | ICD-10-CM

## 2020-05-09 NOTE — Patient Instructions (Signed)
To Jeanne Vasquez,   It was a pleasure working with you today. For your bleeding nose, please cover the affected nostril with Vaseline. Continue taking your allergy medicine in order to avoid itching your nose to let the wound heal. We will see you in 2-3 months time. Additionally, if you are having sinus drainage please consider using a neti pot, which you can get at your pharmacy or drug store. Have a good day!  Sincerely,  Dolan Amen, MD

## 2020-05-10 ENCOUNTER — Encounter: Payer: Self-pay | Admitting: Internal Medicine

## 2020-05-10 LAB — BMP8+ANION GAP
Anion Gap: 15 mmol/L (ref 10.0–18.0)
BUN/Creatinine Ratio: 29 — ABNORMAL HIGH (ref 12–28)
BUN: 24 mg/dL (ref 8–27)
CO2: 22 mmol/L (ref 20–29)
Calcium: 9.6 mg/dL (ref 8.7–10.3)
Chloride: 105 mmol/L (ref 96–106)
Creatinine, Ser: 0.83 mg/dL (ref 0.57–1.00)
GFR calc Af Amer: 86 mL/min/{1.73_m2} (ref >59)
GFR calc non Af Amer: 74 mL/min/{1.73_m2} (ref >59)
Glucose: 80 mg/dL (ref 65–99)
Potassium: 4.2 mmol/L (ref 3.5–5.2)
Sodium: 142 mmol/L (ref 134–144)

## 2020-05-10 LAB — CBC
Hematocrit: 41.2 % (ref 34.0–46.6)
Hemoglobin: 13.2 g/dL (ref 11.1–15.9)
MCH: 25.4 pg — ABNORMAL LOW (ref 26.6–33.0)
MCHC: 32 g/dL (ref 31.5–35.7)
MCV: 79 fL (ref 79–97)
Platelets: 243 10*3/uL (ref 150–450)
RBC: 5.2 x10E6/uL (ref 3.77–5.28)
RDW: 15 % (ref 11.7–15.4)
WBC: 7.7 10*3/uL (ref 3.4–10.8)

## 2020-05-10 MED ORDER — CETIRIZINE HCL 10 MG PO TABS: 10.0000 mg | ORAL_TABLET | Freq: Every day | ORAL | 2 refills | Status: DC

## 2020-05-10 NOTE — Assessment & Plan Note (Signed)
Patient's pressure today is well controlled  Vitals:   05/09/20 1343  BP: 125/84  Pulse: 85  Temp: 98.1 F (36.7 C)  SpO2: 100%  currently on amlodipine 5 mg and lisinopril 20 mg. Patient is compliant with her medication and does not report dizziness, light headedness, or other adverse side effects at this time.  - Continue current regimen.

## 2020-05-10 NOTE — Assessment & Plan Note (Signed)
Patient presents to the clinic for follow up on her epistaxis. She states that since her last follow up with Dr. Gwyneth Revels, she continues to have short episodes of epistaxis that occur 1-2 times per week. She states that the episodes occur for approximately 5 minutes then resolve. She denies having any symptoms of anemia including fatigue, palpitations, dizziness, light headedness, SHOB. She does state that her house is typically cold and dry.  She does have seasonal allergies and has run out of her zyrtec which she states seemed to help reduce her nasal symptoms.   We discussed the need for barrier protection given her housing conditions. On physical examination, she does have a linear laceration on the medial aspect of her R nares, it appears well healed, with no active bleeding, no clots, crusting, or erythema present.  - Instructed to use Vaseline and a Q-tip to cover her R nasal mucosa for barrier protection.  - Refill Zyrtec - Instructed to call clinic if she begins to have an increase in frequency of episodes or longer duration of breathing.

## 2020-05-10 NOTE — Progress Notes (Signed)
   CC: Bloody nose, abdominal pain  HPI:  [name] is a 64 y.o. is here for follow up on her epistaxis and abdominal pain. To see the management of her acute and chronic conditions, please see the A&P under the Encounters tab.  Past Medical History:  Diagnosis Date  . Hemorrhoids   . Hyperlipidemia 02/13/2015   Review of Systems:   Review of Systems  Constitutional: Negative for chills, fever, malaise/fatigue and weight loss.  HENT: Positive for congestion and nosebleeds. Negative for ear discharge, ear pain and sinus pain.   Eyes: Negative for blurred vision and double vision.  Cardiovascular: Negative for chest pain.  Gastrointestinal: Negative for abdominal pain, constipation, diarrhea, nausea and vomiting.  Neurological: Negative for dizziness, weakness and headaches.   Physical Exam:  Vitals:   05/09/20 1343  BP: 125/84  Pulse: 85  Temp: 98.1 F (36.7 C)  TempSrc: Oral  SpO2: 100%  Weight: 166 lb 9.6 oz (75.6 kg)  Height: 4\' 11"  (1.499 m)   Physical Exam Constitutional:      General: She is not in acute distress.    Appearance: She is not ill-appearing, toxic-appearing or diaphoretic.  HENT:     Head: Normocephalic and atraumatic.     Nose:     Comments: Linear laceration noted on the medial side of the right nares. No active bleeding, crusting, clotting, erythema, or purulence noted. Left nares unremarkable.  Eyes:     General:        Right eye: No discharge.        Left eye: No discharge.  Cardiovascular:     Rate and Rhythm: Normal rate and regular rhythm.     Pulses: Normal pulses.     Heart sounds: Normal heart sounds. No murmur heard.  No friction rub. No gallop.   Pulmonary:     Effort: Pulmonary effort is normal.     Breath sounds: Normal breath sounds. No wheezing, rhonchi or rales.  Abdominal:     General: Abdomen is flat. Bowel sounds are normal.     Palpations: Abdomen is soft.     Tenderness: There is no abdominal tenderness. There is no  guarding or rebound.  Neurological:     Mental Status: She is alert and oriented to person, place, and time.  Psychiatric:        Mood and Affect: Mood normal.     Assessment & Plan:   See Encounters Tab for problem based charting.  Patient discussed with Dr. 

## 2020-05-10 NOTE — Assessment & Plan Note (Signed)
Patient following up from her last visit for abdominal pain. Patient reports that she is no longer having pain in the RLQ. On physical exam, BS were present, she was non tender to shallow and deep palpation, no guarding noted. Patient denies having diarrhea or constipation.  - Abdominal pain resolved.

## 2020-05-10 NOTE — Assessment & Plan Note (Signed)
Patient with no recent lab draws in over a year, will order labs to assess for any new developments.  - BMP - CBC

## 2020-05-10 NOTE — Progress Notes (Signed)
[facility] Medicine Clinic Attending ? ?Case discussed with Dr. Winters  At the time of the visit.  We reviewed the resident?s history and exam and pertinent patient test results.  I agree with the assessment, diagnosis, and plan of care documented in the resident?s note.  ?

## 2020-05-15 ENCOUNTER — Encounter: Payer: Self-pay | Admitting: Internal Medicine

## 2020-06-20 MED FILL — LISINOPRIL 20 MG TABLET: 20 | 30 days supply | Qty: 30 | Fill #2

## 2020-06-20 MED FILL — AMLODIPINE BESYLATE 5 MG TA: 5 | 30 days supply | Qty: 30 | Fill #2

## 2020-08-12 ENCOUNTER — Other Ambulatory Visit: Payer: Self-pay | Admitting: Internal Medicine

## 2020-08-12 ENCOUNTER — Other Ambulatory Visit: Payer: Self-pay | Admitting: Student

## 2020-08-12 DIAGNOSIS — I10 Essential (primary) hypertension: Secondary | ICD-10-CM

## 2020-08-12 MED FILL — AMLODIPINE BESYLATE 5 MG TA: 5 | 30 days supply | Qty: 30 | Fill #3

## 2020-08-19 MED FILL — LISINOPRIL 20 MG TABLET: 20 | 30 days supply | Qty: 30 | Fill #0

## 2020-10-07 MED FILL — LISINOPRIL 20 MG TABLET: 20 | 30 days supply | Qty: 30 | Fill #1

## 2020-10-07 MED FILL — AMLODIPINE BESYLATE 5 MG TA: 5 | 30 days supply | Qty: 30 | Fill #4

## 2020-12-24 ENCOUNTER — Other Ambulatory Visit (HOSPITAL_COMMUNITY): Payer: Self-pay

## 2020-12-24 ENCOUNTER — Other Ambulatory Visit: Payer: Self-pay | Admitting: Student

## 2020-12-24 DIAGNOSIS — I10 Essential (primary) hypertension: Secondary | ICD-10-CM

## 2020-12-24 MED FILL — Amlodipine Besylate Tab 5 MG (Base Equivalent): ORAL | 30 days supply | Qty: 30 | Fill #0 | Status: AC

## 2020-12-25 ENCOUNTER — Other Ambulatory Visit: Payer: Self-pay | Admitting: Internal Medicine

## 2020-12-25 ENCOUNTER — Other Ambulatory Visit (HOSPITAL_COMMUNITY): Payer: Self-pay

## 2020-12-25 DIAGNOSIS — I10 Essential (primary) hypertension: Secondary | ICD-10-CM

## 2020-12-25 MED ORDER — LISINOPRIL 20 MG PO TABS
20.0000 mg | ORAL_TABLET | Freq: Every day | ORAL | 2 refills | Status: DC
  Filled 2020-12-25: qty 30, 30d supply, fill #0

## 2020-12-26 ENCOUNTER — Other Ambulatory Visit (HOSPITAL_COMMUNITY): Payer: Self-pay

## 2020-12-30 ENCOUNTER — Other Ambulatory Visit (HOSPITAL_COMMUNITY): Payer: Self-pay

## 2021-01-06 ENCOUNTER — Encounter: Payer: Self-pay | Admitting: Internal Medicine

## 2021-01-06 ENCOUNTER — Ambulatory Visit (INDEPENDENT_AMBULATORY_CARE_PROVIDER_SITE_OTHER): Payer: Medicare Other | Admitting: Internal Medicine

## 2021-01-06 VITALS — BP 125/97 | HR 73 | Temp 98.6°F | Ht 59.0 in | Wt 168.8 lb

## 2021-01-06 DIAGNOSIS — E785 Hyperlipidemia, unspecified: Secondary | ICD-10-CM

## 2021-01-06 DIAGNOSIS — Z1231 Encounter for screening mammogram for malignant neoplasm of breast: Secondary | ICD-10-CM

## 2021-01-06 DIAGNOSIS — Z13820 Encounter for screening for osteoporosis: Secondary | ICD-10-CM | POA: Diagnosis not present

## 2021-01-06 DIAGNOSIS — I10 Essential (primary) hypertension: Secondary | ICD-10-CM | POA: Diagnosis present

## 2021-01-06 NOTE — Progress Notes (Signed)
   CC: Hypertension, routine check up  HPI:  Jeanne Vasquez is a 65 y.o. with the history listed below presenting for follow up of her hypertension and a routine check up. She denies any complaints today, just wanted a general check up. She states that her itchiness, hip and shoulder pain has improved.  Past Medical History:  Diagnosis Date  . Hemorrhoids   . Hyperlipidemia 02/13/2015   Review of Systems:   Constitutional: Negative for chills and fever.  Respiratory: Negative for shortness of breath.   Cardiovascular: Negative for chest pain and leg swelling.  Gastrointestinal: Negative for abdominal pain, nausea and vomiting.  Neurological: Negative for dizziness and headaches.   Physical Exam:  Vitals:   01/06/21 1611 01/06/21 1721  BP: (!) 139/93 (!) 125/97  Pulse: 83 73  Temp: 98.6 F (37 C)   TempSrc: Oral   SpO2: 98%   Weight: 168 lb 12.8 oz (76.6 kg)   Height: 4\' 11"  (1.499 m)    Physical Exam HENT:     Head: Normocephalic and atraumatic.  Eyes:     Conjunctiva/sclera: Conjunctivae normal.     Pupils: Pupils are equal, round, and reactive to light.  Neck:     Thyroid: No thyromegaly.  Cardiovascular:     Rate and Rhythm: Normal rate and regular rhythm.     Heart sounds: Normal heart sounds. No murmur heard. No friction rub. No gallop.   Pulmonary:     Effort: Pulmonary effort is normal. No respiratory distress.     Breath sounds: Normal breath sounds. No wheezing.  Abdominal:     General: Bowel sounds are normal. There is no distension.     Palpations: Abdomen is soft.  Musculoskeletal:        General: Normal range of motion.     Cervical back: Normal range of motion and neck supple.  Skin:    General: Skin is warm and dry.     Findings: No erythema.  Neurological:     Mental Status: She is alert and oriented to person, place, and time.     Gait: Gait is intact.  Psychiatric:        Mood and Affect: Mood and affect normal.     Assessment & Plan:   See  Encounters Tab for problem based charting.  Patient discussed with Dr. 

## 2021-01-06 NOTE — Patient Instructions (Addendum)
[name],  It was a pleasure to see you today. Thank you for coming in.   Today we discussed your blood pressure. In regards to this please continue taking your current medications. I have placed some orders for a screening mammogram. You should be contacted to have this scheduled.  Please return to clinic in 6 months or sooner if needed.   Thank you again for coming in.   Claudean Severance.D.

## 2021-01-07 ENCOUNTER — Encounter: Payer: Self-pay | Admitting: Internal Medicine

## 2021-01-07 ENCOUNTER — Other Ambulatory Visit (HOSPITAL_COMMUNITY): Payer: Self-pay

## 2021-01-07 MED ORDER — LISINOPRIL 20 MG PO TABS
20.0000 mg | ORAL_TABLET | Freq: Every day | ORAL | 2 refills | Status: DC
  Filled 2021-01-07 – 2021-02-17 (×2): qty 30, 30d supply, fill #0
  Filled 2021-03-31: qty 30, 30d supply, fill #1
  Filled 2021-05-07: qty 30, 30d supply, fill #2

## 2021-01-07 NOTE — Progress Notes (Signed)
[facility] Medicine Clinic Attending ° °Case discussed with Dr. Krienke  At the time of the visit.  We reviewed the resident’s history and exam and pertinent patient test results.  I agree with the assessment, diagnosis, and plan of care documented in the resident’s note.  °

## 2021-01-07 NOTE — Assessment & Plan Note (Signed)
Patient is due for a mammogram,  ordered this.

## 2021-01-07 NOTE — Assessment & Plan Note (Signed)
Patient is currently on amlodipine 5 mg daily and lisinopril 20 mg daily. She denies any issues taking her medications however she states that she did forget to take it today. She denied any chest pain, headaches, lightheadedness, dizziness, shortness of breath, palpitations, or vision issues. She does not check her BP at home. Initial BP was mildly elevated at 139/93, repeat was 125/97. She last had a CBC and BMP in 9/21 that was unremarkable.  -Continue amlodipine 5 mg daily and lisinopril 20 mg daily -Consider repeating BMP on next visit

## 2021-02-01 ENCOUNTER — Encounter: Payer: Self-pay | Admitting: *Deleted

## 2021-02-17 ENCOUNTER — Other Ambulatory Visit (HOSPITAL_COMMUNITY): Payer: Self-pay

## 2021-02-17 MED FILL — Amlodipine Besylate Tab 5 MG (Base Equivalent): ORAL | 30 days supply | Qty: 30 | Fill #1 | Status: AC

## 2021-03-11 ENCOUNTER — Encounter: Payer: Self-pay | Admitting: *Deleted

## 2021-03-31 ENCOUNTER — Other Ambulatory Visit (HOSPITAL_COMMUNITY): Payer: Self-pay

## 2021-03-31 ENCOUNTER — Other Ambulatory Visit: Payer: Self-pay | Admitting: Internal Medicine

## 2021-03-31 DIAGNOSIS — I10 Essential (primary) hypertension: Secondary | ICD-10-CM

## 2021-03-31 MED ORDER — AMLODIPINE BESYLATE 5 MG PO TABS
5.0000 mg | ORAL_TABLET | Freq: Every day | ORAL | 9 refills | Status: DC
  Filled 2021-03-31: qty 30, 30d supply, fill #0
  Filled 2021-05-07: qty 30, 30d supply, fill #1
  Filled 2021-06-12: qty 30, 30d supply, fill #2
  Filled 2021-08-05 – 2021-08-15 (×2): qty 30, 30d supply, fill #3
  Filled 2021-09-24: qty 30, 30d supply, fill #4
  Filled 2021-11-03: qty 30, 30d supply, fill #5
  Filled 2021-12-16: qty 30, 30d supply, fill #6
  Filled 2022-02-03: qty 30, 30d supply, fill #7
  Filled 2022-03-06 (×2): qty 30, 30d supply, fill #8

## 2021-04-02 ENCOUNTER — Other Ambulatory Visit (HOSPITAL_COMMUNITY): Payer: Self-pay

## 2021-04-07 ENCOUNTER — Other Ambulatory Visit (HOSPITAL_COMMUNITY): Payer: Self-pay

## 2021-05-07 ENCOUNTER — Other Ambulatory Visit (HOSPITAL_COMMUNITY): Payer: Self-pay

## 2021-05-13 ENCOUNTER — Other Ambulatory Visit (HOSPITAL_COMMUNITY): Payer: Self-pay

## 2021-06-12 ENCOUNTER — Other Ambulatory Visit: Payer: Self-pay | Admitting: Internal Medicine

## 2021-06-12 ENCOUNTER — Other Ambulatory Visit (HOSPITAL_COMMUNITY): Payer: Self-pay

## 2021-06-12 DIAGNOSIS — I10 Essential (primary) hypertension: Secondary | ICD-10-CM

## 2021-06-17 ENCOUNTER — Other Ambulatory Visit (HOSPITAL_COMMUNITY): Payer: Self-pay

## 2021-06-17 MED ORDER — LISINOPRIL 20 MG PO TABS
20.0000 mg | ORAL_TABLET | Freq: Every day | ORAL | 2 refills | Status: DC
  Filled 2021-06-17: qty 30, 30d supply, fill #0
  Filled 2021-08-05 – 2021-08-15 (×2): qty 30, 30d supply, fill #1
  Filled 2021-09-24: qty 30, 30d supply, fill #2

## 2021-06-24 ENCOUNTER — Other Ambulatory Visit (HOSPITAL_COMMUNITY): Payer: Self-pay

## 2021-08-05 ENCOUNTER — Other Ambulatory Visit (HOSPITAL_COMMUNITY): Payer: Self-pay

## 2021-08-13 ENCOUNTER — Other Ambulatory Visit (HOSPITAL_COMMUNITY): Payer: Self-pay

## 2021-08-15 ENCOUNTER — Other Ambulatory Visit (HOSPITAL_COMMUNITY): Payer: Self-pay

## 2021-09-24 ENCOUNTER — Other Ambulatory Visit (HOSPITAL_COMMUNITY): Payer: Self-pay

## 2021-11-03 ENCOUNTER — Other Ambulatory Visit: Payer: Self-pay | Admitting: Internal Medicine

## 2021-11-03 ENCOUNTER — Other Ambulatory Visit (HOSPITAL_COMMUNITY): Payer: Self-pay

## 2021-11-03 DIAGNOSIS — I10 Essential (primary) hypertension: Secondary | ICD-10-CM

## 2021-11-03 MED ORDER — LISINOPRIL 20 MG PO TABS
20.0000 mg | ORAL_TABLET | Freq: Every day | ORAL | 2 refills | Status: DC
  Filled 2021-11-03 – 2021-11-17 (×2): qty 30, 30d supply, fill #0
  Filled 2021-12-16: qty 30, 30d supply, fill #1
  Filled 2022-02-03: qty 30, 30d supply, fill #2

## 2021-11-04 ENCOUNTER — Other Ambulatory Visit (HOSPITAL_COMMUNITY): Payer: Self-pay

## 2021-11-12 ENCOUNTER — Other Ambulatory Visit (HOSPITAL_COMMUNITY): Payer: Self-pay

## 2021-11-17 ENCOUNTER — Other Ambulatory Visit (HOSPITAL_COMMUNITY): Payer: Self-pay

## 2021-12-16 ENCOUNTER — Other Ambulatory Visit (HOSPITAL_COMMUNITY): Payer: Self-pay

## 2022-01-21 ENCOUNTER — Ambulatory Visit (INDEPENDENT_AMBULATORY_CARE_PROVIDER_SITE_OTHER): Payer: Medicare Other | Admitting: Student

## 2022-01-21 ENCOUNTER — Encounter: Payer: Self-pay | Admitting: Student

## 2022-01-21 ENCOUNTER — Ambulatory Visit (HOSPITAL_COMMUNITY)
Admission: RE | Admit: 2022-01-21 | Discharge: 2022-01-21 | Disposition: A | Discharge status: Home / Self Care | Payer: Medicare Other | Source: Ambulatory Visit | Attending: Student in an Organized Health Care Education/Training Program | Admitting: Student in an Organized Health Care Education/Training Program

## 2022-01-21 ENCOUNTER — Other Ambulatory Visit: Payer: Self-pay | Admitting: Student

## 2022-01-21 VITALS — BP 135/87 | HR 70 | Temp 97.9°F | Ht 60.0 in | Wt 166.7 lb

## 2022-01-21 DIAGNOSIS — M25551 Pain in right hip: Secondary | ICD-10-CM | POA: Diagnosis not present

## 2022-01-21 DIAGNOSIS — I10 Essential (primary) hypertension: Secondary | ICD-10-CM

## 2022-01-21 DIAGNOSIS — Z6830 Body mass index (BMI) 30.0-30.9, adult: Secondary | ICD-10-CM

## 2022-01-21 DIAGNOSIS — R7303 Prediabetes: Secondary | ICD-10-CM

## 2022-01-21 DIAGNOSIS — Z1231 Encounter for screening mammogram for malignant neoplasm of breast: Secondary | ICD-10-CM

## 2022-01-21 DIAGNOSIS — R7309 Other abnormal glucose: Secondary | ICD-10-CM | POA: Diagnosis not present

## 2022-01-21 DIAGNOSIS — E785 Hyperlipidemia, unspecified: Secondary | ICD-10-CM | POA: Diagnosis not present

## 2022-01-21 DIAGNOSIS — R7302 Impaired glucose tolerance (oral): Secondary | ICD-10-CM

## 2022-01-21 DIAGNOSIS — Z0000 Encounter for general adult medical examination without abnormal findings: Secondary | ICD-10-CM

## 2022-01-21 DIAGNOSIS — E6609 Other obesity due to excess calories: Secondary | ICD-10-CM | POA: Diagnosis not present

## 2022-01-21 DIAGNOSIS — Z0001 Encounter for general adult medical examination with abnormal findings: Secondary | ICD-10-CM | POA: Diagnosis not present

## 2022-01-21 HISTORY — DX: Pain in right hip: M25.551

## 2022-01-21 LAB — POCT GLYCOSYLATED HEMOGLOBIN (HGB A1C): Hemoglobin A1C: 5.6 % (ref 4.0–5.6)

## 2022-01-21 LAB — GLUCOSE, CAPILLARY: Glucose-Capillary: 92 mg/dL (ref 70–99)

## 2022-01-21 NOTE — Assessment & Plan Note (Addendum)
Patient with 1 month of pain with walking along the lateral thigh distal to the greater trochanter as well as right medial thigh pain. Worse with ambulating and improves with rest. Radiates to right lateral buttock.  Tylenol somewhat improves pain. Difficulty with external rotation of the right hip. Will check R hip xray to further evaluate for hip pathology.  ?

## 2022-01-21 NOTE — Assessment & Plan Note (Signed)
Patient concern for diabetes.  She has a history of elevated glucose to 111 on chart review.  Also with history of obesity, hypertension, and hyperlipidemia concerning for possible metabolic syndrome.  We will check A1c today.  A1c was 5.6 and normal today. ?

## 2022-01-21 NOTE — Patient Instructions (Signed)

## 2022-01-21 NOTE — Assessment & Plan Note (Signed)
151/85 and BP 135/87 on repeat. Continue lisinopril and amlodipine. Will check BMP today. ?

## 2022-01-21 NOTE — Assessment & Plan Note (Signed)
Mammogram ordered

## 2022-01-21 NOTE — Assessment & Plan Note (Signed)
Repeat lipid panel today. 

## 2022-01-21 NOTE — Patient Instructions (Signed)
Right hip pain ?We will check a xray of your hip to see what may be causing your pain ? ?We will also check labs today  ?

## 2022-01-21 NOTE — Progress Notes (Signed)
? ?Established Patient Office Visit ? ?Subjective   ?Patient ID: Jeanne Vasquez, female    DOB: 1955/12/16  Age: 66 y.o. MRN: [id number] ? ?Chief Complaint  ?Patient presents with  ? Pain  ?  PAIN LOWER LEFT BACK DOWN TO HIP DOWN LEG x 1 MONTH / WANT TO BE CHECKED FOR DM/ ? MEDICATION REFILL.  / NEEDING DENTAL WORK  ? ? ?Jeanne Vasquez is a 66 year old who presents today for 1 month of right hip pain. Please refer to problem based charting for further details and assessment and plan of current problem and chronic medical conditions. ? ? ? ?Patient Active Problem List  ? Diagnosis Date Noted  ? Right hip pain 01/21/2022  ? Epistaxis 11/28/2019  ? Right knee pain 03/16/2019  ? Acute pain of right shoulder 10/31/2018  ? Numbness of fingers of both hands 12/20/2017  ? Hypertension 11/22/2017  ? Hyperlipidemia 02/13/2015  ? Screening mammogram, encounter for 02/12/2015  ? ?  ? ?Review of Systems  ?All other systems reviewed and are negative. ? ?  ?Objective:  ?  ? ?BP 135/87 (BP Location: Left Arm, Patient Position: Sitting, Cuff Size: Normal)   Pulse 70   Temp 97.9 ?F (36.6 ?C) (Oral)   Ht 5' (1.524 m)   Wt 166 lb 11.2 oz (75.6 kg)   SpO2 100%   BMI 32.56 kg/m?  ?BP Readings from Last 3 Encounters:  ?01/21/22 135/87  ?01/06/21 (!) 125/97  ?05/09/20 125/84  ? ?  ? ?Physical Exam ?Constitutional:   ?   Appearance: Normal appearance. She is obese. She is not ill-appearing.  ?Cardiovascular:  ?   Rate and Rhythm: Normal rate and regular rhythm.  ?Pulmonary:  ?   Effort: Pulmonary effort is normal.  ?   Breath sounds: Normal breath sounds.  ?Abdominal:  ?   General: Abdomen is flat. Bowel sounds are normal.  ?   Palpations: Abdomen is soft.  ?Musculoskeletal:  ?   Right hip: Tenderness (lateral and medial thigh with deep palpation) present. No deformity or bony tenderness. Decreased range of motion (external rotation). Normal strength.  ?   Left hip: Normal.  ?Neurological:  ?   Mental Status: She is alert.  ? ? ? ?Results for orders  placed or performed in visit on 01/21/22  ?Glucose, capillary  ?Result Value Ref Range  ? Glucose-Capillary 92 70 - 99 mg/dL  ?Results for orders placed or performed in visit on 01/21/22  ?POC Hbg A1C  ?Result Value Ref Range  ? Hemoglobin A1C 5.6 4.0 - 5.6 %  ? HbA1c POC (<> result, manual entry)    ? HbA1c, POC (prediabetic range)    ? HbA1c, POC (controlled diabetic range)    ? ? ?Last hemoglobin A1c ?Lab Results  ?Component Value Date  ? HGBA1C 5.6 01/21/2022  ? ?  ?Assessment & Plan:  ? ?Problem List Items Addressed This Visit   ? ?  ? Cardiovascular and Mediastinum  ? Hypertension  ?  151/85 and BP 135/87 on repeat. Continue lisinopril and amlodipine. Will check BMP today. ? ?  ?  ? Relevant Orders  ? BMP8+Anion Gap  ?  ? Other  ? Screening mammogram, encounter for  ?  Mammogram ordered ? ?  ?  ? Hyperlipidemia  ?  Repeat lipid panel today. ? ?  ?  ? Relevant Orders  ? Lipid Profile  ? Right hip pain - Primary  ?  Patient with 1 month of pain with walking along  the lateral thigh distal to the greater trochanter as well as right medial thigh pain. Worse with ambulating and improves with rest. Tylenol somewhat improves pain. Difficulty with external rotation of the right hip. Will check R hip xray to further evaluate for hip pathology.  ? ?  ?  ? ?Other Visit Diagnoses   ? ? Class 1 obesity due to excess calories without serious comorbidity with body mass index (BMI) of 30.0 to 30.9 in adult      ? Relevant Orders  ? POC Hbg A1C (Completed)  ? Impaired glucose tolerance      ? Relevant Orders  ? POC Hbg A1C (Completed)  ? Encounter for screening mammogram for malignant neoplasm of breast      ? Relevant Orders  ? MM Digital Screening  ? ?  ? ? ?Return in about 2 months (around 03/23/2022).  ? ? ?Jeanne Simmonds, MD ? ?Discussed with Dr. Oswaldo Done  ?

## 2022-01-21 NOTE — Progress Notes (Signed)
Subjective:   Jeanne Vasquez is a 66 y.o. female who presents for an Initial Medicare Annual Wellness Visit.  Review of Systems    Defer to PCP.  Cardiac Risk Factors include: none     Objective:    There were no vitals filed for this visit. There is no height or weight on file to calculate BMI.     01/21/2022   11:44 AM 01/21/2022   11:10 AM 05/09/2020    1:47 PM 11/27/2019    3:24 PM 03/15/2019    2:29 PM 10/31/2018    2:22 PM 10/05/2018    2:57 PM  Advanced Directives  Does Patient Have a Medical Advance Directive? No No No No No No No  Would patient like information on creating a medical advance directive? No - Patient declined No - Patient declined No - Patient declined No - Patient declined No - Patient declined No - Patient declined No - Patient declined    Current Medications (verified) Outpatient Encounter Medications as of 01/21/2022  Medication Sig   amLODipine (NORVASC) 5 MG tablet Take 1 tablet (5 mg total) by mouth daily.   lisinopril (ZESTRIL) 20 MG tablet Take 1 tablet (20 mg total) by mouth daily.   No facility-administered encounter medications on file as of 01/21/2022.    Allergies (verified) Parke Simmers allergy]   History: Past Medical History:  Diagnosis Date   Hemorrhoids    Hyperlipidemia 02/13/2015   Past Surgical History:  Procedure Laterality Date   NO PAST SURGERIES     Family History  Problem Relation Age of Onset   Cancer Neg Hx    Heart disease Neg Hx    Hypertension Neg Hx    Colon cancer Neg Hx    Social History   Socioeconomic History   Marital status: Married    Spouse name: Janene Harvey   Number of children: 4   Years of education: 0   Highest education level: Not on file  Occupational History   Occupation: Unemployed  Tobacco Use   Smoking status: Never   Smokeless tobacco: Never  Vaping Use   Vaping Use: Not on file  Substance and Sexual Activity   Alcohol use: No    Alcohol/week: 0.0 standard drinks   Drug use: No    Sexual activity: Yes  Other Topics Concern   Not on file  Social History Narrative   Came from [place] by herself. She has 4 children.   Social Determinants of Health   Financial Resource Strain: Low Risk    Difficulty of Paying Living Expenses: Not hard at all  Food Insecurity: No Food Insecurity   Worried About Programme researcher, broadcasting/film/video in the Last Year: Never true   Ran Out of Food in the Last Year: Never true  Transportation Needs: No Transportation Needs   Lack of Transportation (Medical): No   Lack of Transportation (Non-Medical): No  Physical Activity: Insufficiently Active   Days of Exercise per Week: 2 days   Minutes of Exercise per Session: 20 min  Stress: No Stress Concern Present   Feeling of Stress : Not at all  Social Connections: Moderately Isolated   Frequency of Communication with Friends and Family: More than three times a week   Frequency of Social Gatherings with Friends and Family: More than three times a week   Attends Religious Services: Never   Database administrator or Organizations: No   Attends Banker Meetings: Never   Marital Status: Married  Tobacco Counseling Counseling given: Not Answered   Clinical Intake:  Pre-visit preparation completed: Yes  Pain : No/denies pain     Diabetes: No  How often do you need to have someone help you when you read instructions, pamphlets, or other written materials from your doctor or pharmacy?: 5 - Always What is the last grade level you completed in school?: no school  Diabetic?No  Interpreter Needed?: No  Information entered by :: Jahmal Dunavant, CMA 01/21/2022   Activities of Daily Living    01/21/2022   11:44 AM 01/21/2022   11:11 AM  In your present state of health, do you have any difficulty performing the following activities:  Hearing? 0 0  Vision? 0 0  Difficulty concentrating or making decisions? 0 0  Walking or climbing stairs? 0 0  Dressing or bathing? 0 0  Doing errands,  shopping? 0 0  Preparing Food and eating ? N   Using the Toilet? N   In the past six months, have you accidently leaked urine? N   Do you have problems with loss of bowel control? N   Managing your Medications? N   Managing your Finances? N   Housekeeping or managing your Housekeeping? N     Patient Care Team: Ellison Carwin, MD as PCP - General  Indicate any recent Medical Services you may have received from other than [facility] providers in the past year (date may be approximate).     Assessment:   This is a routine wellness examination for Nil.  Hearing/Vision screen No results found.  Dietary issues and exercise activities discussed: Current Exercise Habits: Home exercise routine, Time (Minutes): 20, Frequency (Times/Week): 3, Weekly Exercise (Minutes/Week): 60, Intensity: Mild   Goals Addressed   None   Depression Screen    01/21/2022   11:40 AM 01/21/2022   11:11 AM 01/06/2021    5:20 PM 01/06/2021    4:25 PM 05/09/2020    1:48 PM 11/27/2019    3:35 PM 03/15/2019    2:29 PM  PHQ 2/9 Scores  PHQ - 2 Score 0 0 0 0 0 0 0    Fall Risk    01/21/2022   11:44 AM 01/21/2022   11:10 AM 01/06/2021    4:24 PM 05/09/2020    1:47 PM 11/27/2019    3:24 PM  Fall Risk   Falls in the past year? 0 0 1 0 0  Number falls in past yr: 0  0    Injury with Fall? 0  0    Risk for fall due to : No Fall Risks No Fall Risks     Follow up Falls evaluation completed Falls evaluation completed  Falls prevention discussed Falls prevention discussed    FALL RISK PREVENTION PERTAINING TO THE HOME:  Any stairs in or around the home? No  If so, are there any without handrails? No  Home free of loose throw rugs in walkways, pet beds, electrical cords, etc? Yes  Adequate lighting in your home to reduce risk of falls? Yes   ASSISTIVE DEVICES UTILIZED TO PREVENT FALLS:  Life alert? No  Use of a cane, walker or w/c? No  Grab bars in the bathroom? No  Shower chair or bench in shower? No  Elevated toilet  seat or a handicapped toilet? No   TIMED UP AND GO:  Was the test performed? No .  Length of time to ambulate 10 feet: N/A sec.     Cognitive Function:  01/21/2022   11:53 AM  6CIT Screen  What Year? 0 points  What month? 0 points  What time? 0 points  Count back from 20 0 points  Months in reverse 0 points  Repeat phrase 0 points  Total Score 0 points    Immunizations Immunization History  Administered Date(s) Administered   Influenza Whole 10/09/2005   Influenza,inj,Quad PF,6+ Mos 08/20/2016   PFIZER(Purple Top)SARS-COV-2 Vaccination 10/12/2020, 10/26/2020, 11/02/2020   Tdap 12/20/2017   Unspecified SARS-COV-2 Vaccination 10/12/2020, 10/26/2020    TDAP status: Up to date  Flu Vaccine status: Up to date  Pneumococcal vaccine status: Due, Education has been provided regarding the importance of this vaccine. Advised may receive this vaccine at local pharmacy or Health Dept. Aware to provide a copy of the vaccination record if obtained from local pharmacy or Health Dept. Verbalized acceptance and understanding.  Covid-19 vaccine status: Completed vaccines  Qualifies for Shingles Vaccine? Yes   Zostavax completed No   Shingrix Completed?: No.    Education has been provided regarding the importance of this vaccine. Patient has been advised to call insurance company to determine out of pocket expense if they have not yet received this vaccine. Advised may also receive vaccine at local pharmacy or Health Dept. Verbalized acceptance and understanding.  Screening Tests Health Maintenance  Topic Date Due   Hepatitis C Screening  Never done   MAMMOGRAM  Never done   Zoster Vaccines- Shingrix (1 of 2) Never done   Pneumonia Vaccine 38+ Years old (1 - PCV) Never done   DEXA SCAN  Never done   COVID-19 Vaccine (2 - Pfizer series) 11/23/2020   INFLUENZA VACCINE  04/07/2022   COLONOSCOPY (Pts 45-18yrs Insurance coverage will need to be confirmed)  09/30/2025    TETANUS/TDAP  12/21/2027   HPV VACCINES  Aged Out    Health Maintenance  Health Maintenance Due  Topic Date Due   Hepatitis C Screening  Never done   MAMMOGRAM  Never done   Zoster Vaccines- Shingrix (1 of 2) Never done   Pneumonia Vaccine 29+ Years old (1 - PCV) Never done   DEXA SCAN  Never done   COVID-19 Vaccine (2 - Pfizer series) 11/23/2020    Colorectal cancer screening: Type of screening: Colonoscopy. Completed 10/01/2015. Repeat every 10 years  Mammogram status: Ordered defer to PCP. Pt provided with contact info and advised to call to schedule appt.   Bone Density status: Ordered defer to PCP. Pt provided with contact info and advised to call to schedule appt.  Lung Cancer Screening: (Low Dose CT Chest recommended if Age 34-80 years, 30 pack-year currently smoking OR have quit w/in 15years.) does not qualify.   Lung Cancer Screening Referral: N/A  Additional Screening:  Hepatitis C Screening: does qualify; Not Completed  Vision Screening: Recommended annual ophthalmology exams for early detection of glaucoma and other disorders of the eye. Is the patient up to date with their annual eye exam?  No  Who is the provider or what is the name of the office in which the patient attends annual eye exams? Defer to PCP.  If pt is not established with a provider, would they like to be referred to a provider to establish care? Yes .   Dental Screening: Recommended annual dental exams for proper oral hygiene  Community Resource Referral / Chronic Care Management: CRR required this visit?  No   CCM required this visit?  No      Plan:  I have personally reviewed and noted the following in the patient's chart:   Medical and social history Use of alcohol, tobacco or illicit drugs  Current medications and supplements including opioid prescriptions. Patient is not currently taking opioid prescriptions. Functional ability and status Nutritional status Physical  activity Advanced directives List of other physicians Hospitalizations, surgeries, and ER visits in previous 12 months Vitals Screenings to include cognitive, depression, and falls Referrals and appointments  In addition, I have reviewed and discussed with patient certain preventive protocols, quality metrics, and best practice recommendations. A written personalized care plan for preventive services as well as general preventive health recommendations were provided to patient.     Izaias Krupka, CMA   01/21/2022   Nurse Notes: Face to Face 20 minutes.   Ms. Harley Hallmarky , Thank you for taking time to come for your Medicare Wellness Visit. I appreciate your ongoing commitment to your health goals. Please review the following plan we discussed and let me know if I can assist you in the future.   These are the goals we discussed:  Goals   None     This is a list of the screening recommended for you and due dates:  Health Maintenance  Topic Date Due   Hepatitis C Screening: USPSTF Recommendation to screen - Ages 7118-79 yo.  Never done   Mammogram  Never done   Zoster (Shingles) Vaccine (1 of 2) Never done   Pneumonia Vaccine (1 - PCV) Never done   DEXA scan (bone density measurement)  Never done   COVID-19 Vaccine (2 - Pfizer series) 11/23/2020   Flu Shot  04/07/2022   Colon Cancer Screening  09/30/2025   Tetanus Vaccine  12/21/2027   HPV Vaccine  Aged Out

## 2022-01-22 LAB — LIPID PANEL
Chol/HDL Ratio: 3.2 ratio (ref 0.0–4.4)
Cholesterol, Total: 209 mg/dL — ABNORMAL HIGH (ref 100–199)
HDL: 66 mg/dL (ref >39)
LDL Chol Calc (NIH): 128 mg/dL — ABNORMAL HIGH (ref 0–99)
Triglycerides: 87 mg/dL (ref 0–149)
VLDL Cholesterol Cal: 15 mg/dL (ref 5–40)

## 2022-01-22 LAB — BMP8+ANION GAP
Anion Gap: 4 mmol/L — ABNORMAL LOW (ref 10.0–18.0)
BUN/Creatinine Ratio: 26 (ref 12–28)
BUN: 19 mg/dL (ref 8–27)
CO2: 33 mmol/L — ABNORMAL HIGH (ref 20–29)
Calcium: 9.4 mg/dL (ref 8.7–10.3)
Chloride: 103 mmol/L (ref 96–106)
Creatinine, Ser: 0.72 mg/dL (ref 0.57–1.00)
Glucose: 85 mg/dL (ref 70–99)
Potassium: 4.4 mmol/L (ref 3.5–5.2)
Sodium: 140 mmol/L (ref 134–144)
eGFR: 92 mL/min/{1.73_m2} (ref >59)

## 2022-01-22 NOTE — Progress Notes (Signed)
Internal Medicine Clinic Attending ? ?Case discussed with Dr. Liang  At the time of the visit.  We reviewed the resident?s history and exam and pertinent patient test results.  I agree with the assessment, diagnosis, and plan of care documented in the resident?s note. ? ?

## 2022-02-03 ENCOUNTER — Other Ambulatory Visit (HOSPITAL_COMMUNITY): Payer: Self-pay

## 2022-02-03 NOTE — Progress Notes (Signed)
Internal Medicine Clinic Attending  Case discussed with Dr. Liang soon after the resident saw the patient.  I reviewed the AWV findings.  I agree with the assessment, diagnosis, and plan of care documented in the AWV note.     

## 2022-03-01 ENCOUNTER — Encounter: Payer: Self-pay | Admitting: *Deleted

## 2022-03-06 ENCOUNTER — Other Ambulatory Visit: Payer: Self-pay | Admitting: Internal Medicine

## 2022-03-06 ENCOUNTER — Other Ambulatory Visit (HOSPITAL_COMMUNITY): Payer: Self-pay

## 2022-03-06 DIAGNOSIS — I10 Essential (primary) hypertension: Secondary | ICD-10-CM

## 2022-03-06 MED ORDER — LISINOPRIL 20 MG PO TABS
20.0000 mg | ORAL_TABLET | Freq: Every day | ORAL | 4 refills | Status: DC
  Filled 2022-03-06: qty 30, 30d supply, fill #0
  Filled 2022-04-20: qty 30, 30d supply, fill #1
  Filled 2022-05-12: qty 30, 30d supply, fill #2
  Filled 2022-07-24: qty 30, 30d supply, fill #3
  Filled 2022-09-28: qty 30, 30d supply, fill #4

## 2022-04-20 ENCOUNTER — Other Ambulatory Visit (HOSPITAL_COMMUNITY): Payer: Self-pay

## 2022-04-20 ENCOUNTER — Other Ambulatory Visit: Payer: Self-pay | Admitting: Student

## 2022-04-20 DIAGNOSIS — I10 Essential (primary) hypertension: Secondary | ICD-10-CM

## 2022-04-20 MED ORDER — AMLODIPINE BESYLATE 5 MG PO TABS
5.0000 mg | ORAL_TABLET | Freq: Every day | ORAL | 9 refills | Status: DC
  Filled 2022-04-20: qty 30, 30d supply, fill #0
  Filled 2022-05-12: qty 30, 30d supply, fill #1
  Filled 2022-07-24: qty 30, 30d supply, fill #2
  Filled 2022-09-28: qty 30, 30d supply, fill #3
  Filled 2022-11-16: qty 30, 30d supply, fill #4

## 2022-05-07 ENCOUNTER — Other Ambulatory Visit (HOSPITAL_COMMUNITY): Payer: Self-pay

## 2022-05-12 ENCOUNTER — Other Ambulatory Visit (HOSPITAL_COMMUNITY): Payer: Self-pay

## 2022-05-13 ENCOUNTER — Other Ambulatory Visit (HOSPITAL_COMMUNITY): Payer: Self-pay

## 2022-07-24 ENCOUNTER — Other Ambulatory Visit (HOSPITAL_COMMUNITY): Payer: Self-pay

## 2022-07-27 ENCOUNTER — Other Ambulatory Visit (HOSPITAL_COMMUNITY): Payer: Self-pay

## 2022-09-16 ENCOUNTER — Other Ambulatory Visit (HOSPITAL_COMMUNITY): Payer: Self-pay

## 2022-09-28 ENCOUNTER — Other Ambulatory Visit (HOSPITAL_COMMUNITY): Payer: Self-pay

## 2022-11-16 ENCOUNTER — Other Ambulatory Visit (HOSPITAL_COMMUNITY): Payer: Self-pay

## 2022-11-16 ENCOUNTER — Other Ambulatory Visit: Payer: Self-pay | Admitting: Student

## 2022-11-16 DIAGNOSIS — I10 Essential (primary) hypertension: Secondary | ICD-10-CM

## 2022-11-17 ENCOUNTER — Other Ambulatory Visit (HOSPITAL_COMMUNITY): Payer: Self-pay

## 2022-11-17 MED ORDER — LISINOPRIL 20 MG PO TABS
20.0000 mg | ORAL_TABLET | Freq: Every day | ORAL | 4 refills | Status: DC
  Filled 2022-11-17: qty 30, 30d supply, fill #0

## 2022-11-23 ENCOUNTER — Other Ambulatory Visit (HOSPITAL_COMMUNITY): Payer: Self-pay

## 2022-12-18 ENCOUNTER — Ambulatory Visit (INDEPENDENT_AMBULATORY_CARE_PROVIDER_SITE_OTHER): Payer: Medicare Other | Admitting: Student

## 2022-12-18 ENCOUNTER — Other Ambulatory Visit (HOSPITAL_COMMUNITY): Payer: Self-pay

## 2022-12-18 ENCOUNTER — Encounter: Payer: Self-pay | Admitting: Student

## 2022-12-18 VITALS — BP 123/76 | HR 70 | Temp 97.7°F | Wt 158.9 lb

## 2022-12-18 DIAGNOSIS — H1010 Acute atopic conjunctivitis, unspecified eye: Secondary | ICD-10-CM | POA: Insufficient documentation

## 2022-12-18 DIAGNOSIS — H1011 Acute atopic conjunctivitis, right eye: Secondary | ICD-10-CM

## 2022-12-18 DIAGNOSIS — Z23 Encounter for immunization: Secondary | ICD-10-CM | POA: Diagnosis not present

## 2022-12-18 DIAGNOSIS — E785 Hyperlipidemia, unspecified: Secondary | ICD-10-CM | POA: Diagnosis not present

## 2022-12-18 DIAGNOSIS — I10 Essential (primary) hypertension: Secondary | ICD-10-CM

## 2022-12-18 DIAGNOSIS — M25562 Pain in left knee: Secondary | ICD-10-CM | POA: Insufficient documentation

## 2022-12-18 DIAGNOSIS — Z0000 Encounter for general adult medical examination without abnormal findings: Secondary | ICD-10-CM

## 2022-12-18 DIAGNOSIS — Z1231 Encounter for screening mammogram for malignant neoplasm of breast: Secondary | ICD-10-CM

## 2022-12-18 DIAGNOSIS — G8929 Other chronic pain: Secondary | ICD-10-CM

## 2022-12-18 DIAGNOSIS — Z1159 Encounter for screening for other viral diseases: Secondary | ICD-10-CM

## 2022-12-18 MED ORDER — ACETAMINOPHEN 500 MG PO TABS
500.0000 mg | ORAL_TABLET | Freq: Four times a day (QID) | ORAL | 2 refills | Status: AC | PRN
  Filled 2022-12-18: qty 100, 25d supply, fill #0

## 2022-12-18 MED ORDER — DICLOFENAC SODIUM 1 % EX GEL
2.0000 g | Freq: Four times a day (QID) | CUTANEOUS | 1 refills | Status: DC
  Filled 2022-12-18: qty 200, 30d supply, fill #0

## 2022-12-18 MED ORDER — AMLODIPINE BESYLATE 5 MG PO TABS
5.0000 mg | ORAL_TABLET | Freq: Every day | ORAL | 3 refills | Status: DC
  Filled 2022-12-18: qty 30, 30d supply, fill #0
  Filled 2023-01-25: qty 30, 30d supply, fill #1
  Filled 2023-03-15 (×2): qty 30, 30d supply, fill #2
  Filled 2023-04-19: qty 30, 30d supply, fill #3
  Filled 2023-06-21: qty 30, 30d supply, fill #4
  Filled 2023-08-09: qty 30, 30d supply, fill #5
  Filled 2023-09-20: qty 30, 30d supply, fill #6
  Filled 2023-10-25: qty 30, 30d supply, fill #7
  Filled 2023-11-24: qty 30, 30d supply, fill #8

## 2022-12-18 MED ORDER — LISINOPRIL 20 MG PO TABS
20.0000 mg | ORAL_TABLET | Freq: Every day | ORAL | 3 refills | Status: DC
  Filled 2022-12-18: qty 30, 30d supply, fill #0
  Filled 2023-01-25: qty 30, 30d supply, fill #1
  Filled 2023-03-15 (×2): qty 30, 30d supply, fill #2
  Filled 2023-04-19: qty 30, 30d supply, fill #3
  Filled 2023-06-21: qty 30, 30d supply, fill #4
  Filled 2023-08-09: qty 30, 30d supply, fill #5
  Filled 2023-09-20: qty 30, 30d supply, fill #6
  Filled 2023-10-25: qty 30, 30d supply, fill #7
  Filled 2023-11-24: qty 30, 30d supply, fill #8

## 2022-12-18 NOTE — Assessment & Plan Note (Signed)
Patient's BP well-controlled on current regimen with SBP in the 120s. She denies any blurry vision, headaches or dizziness.  She needs refill on her medications.  Plan: -Refill lisinopril 20 mg daily -Refill amlodipine 5 mg daily -Repeat BMP  Addendum: Repeat BMP shows normal kidney function and no electrolyte abnormalities.

## 2022-12-18 NOTE — Patient Instructions (Signed)
Thank you, [name] for allowing [place] to provide your care today. Today we discussed your physical, blood pressure, cholesterol, knee pain, watery eyes and breast cancer screening.    For your watery eyes, I want you to buy over-the-counter antihistamine eyedrops such as Pataday, Systane Zaditor, Visine, etc and follow the instructions.   I have ordered the following labs for you:  Lab Orders         BMP8+Anion Gap         Hepatitis C Ab reflex to Quant PCR         Lipid Profile      I will call if any are abnormal. All of your labs can be accessed through "My Chart".   I have ordered the following tests: Mammogram  I have ordered the following medication/changed the following medications:  Voltaren gel 2 g, apply 4 times daily to left knee Take Tylenol 500 mg every 6 hours as needed for pain  My Chart Access: [email or web address]  Please follow-up in 6 months  Please make sure to arrive 15 minutes prior to your next appointment. If you arrive late, you may be asked to reschedule.    We look forward to seeing you next time. Please call our clinic at [phone] if you have any questions or concerns. The best time to call is Monday-Friday from 9am-4pm, but there is someone available 24/7. If after hours or the weekend, call the main hospital number and ask for the Internal Medicine Resident On-Call. If you need medication refills, please notify your pharmacy one week in advance and they will send [place] a request.   Thank you for letting us take part in your care. Wishing you the best!  Jeanne Rainwater, MD 12/18/2022, 10:30 AM IM Resident, PGY-3 Jeanne Vasquez 41:10

## 2022-12-18 NOTE — Progress Notes (Signed)
CC: Follow-up, yearly physical  HPI:  Jeanne Vasquez is a 67 y.o. female with PMH as below who presents accompanied by [facility] interpreter for yearly physical. Please see problem based charting for evaluation, assessment and plan.  Past Medical History:  Diagnosis Date   Hemorrhoids    Hyperlipidemia 02/13/2015    Review of Systems:  Constitutional: Negative for fever or fatigue Eyes: Positive for watery and itchy eyes Respiratory: Negative for shortness of breath MSK: Positive for left knee pain.  Negative for back pain or trauma.  Abdomen: Negative for abdominal pain, constipation or diarrhea Neuro: Negative for headache, numbness or weakness  Physical Exam: General: Pleasant, well-appearing elderly woman.  No acute distress. HEENT: MMM. PERRLA. EOMI. Cataracts bilaterally. Cardiac: RRR. No murmurs, rubs or gallops. No LE edema Respiratory: Lungs CTAB. No wheezing or crackles. Abdominal: Soft, symmetric and non tender. Normal BS. Skin: Warm, dry and intact without rashes or lesions MSK: No tenderness to palpation of the left knee, no crepitus or effusion. Normal ROM of all extremities. Neuro: A&O x 3. Moves all extremities. Normal sensation to gross touch. Psych: Appropriate mood and affect.  Vitals:   12/18/22 1001  BP: 123/76  Pulse: 70  Temp: 97.7 F (36.5 C)  TempSrc: Oral  SpO2: 100%  Weight: 158 lb 14.4 oz (72.1 kg)    Assessment & Plan:   Hypertension Patient's BP well-controlled on current regimen with SBP in the 120s. She denies any blurry vision, headaches or dizziness.  She needs refill on her medications.  Plan: -Refill lisinopril 20 mg daily -Refill amlodipine 5 mg daily -Repeat BMP  Hyperlipidemia Lipid panel last year showed LDL of 128. Patient's weight is down 8 lbs since a year ago so I anticipate some improvement in her cholesterol. -Repeat lipid panel  Left knee pain Patient reports aching left knee for the last few months.  She reports pain in  her left knee on walking and afterwards being on her feet for too long.  She has no left knee pain at rest and denies any recent trauma to her knee.  The pain has not interfere with her ADLs or limited her mobility. On exam, she has normal range of motion of the left knee, there is no tenderness to palpation of the knee and there is no crepitus or effusion. 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  Plan: -Voltaren 1% gel, apply 4 times daily to left knee -Tylenol 500 mg every 6 hours as needed for pain -Advised to follow-up if pain does not improve after a few weeks or worsens.  Allergic conjunctivitis Patient presents today for evaluation of watery eyes worse in her right eye.  She reports associated itchy eyes but denies any eye pain or change in her vision.  She started having these symptoms last few weeks when she goes outside.  On exam, patient has mild cataracts bilaterally but no pain with extraocular movements of the eye, no eye crusting and PERRLA. I advised patient to try over-the-counter antihistamine eyedrops. -OTC antihistamine eyedrop such as Pataday, Systane Zaditor, Visine, etc  Screening mammogram, encounter for Patient reports she she was unable to get her mammogram last year because she could not find the office.  She has not had any breast cancer screening. -Referral placed for mammogram    See Encounters Tab for problem based charting.  Patient discussed with Dr. Anthoney Harada,  MD, MPH

## 2022-12-18 NOTE — Assessment & Plan Note (Signed)
Lipid panel last year showed LDL of 128. Patient's weight is down 8 lbs since a year ago. -Repeat lipid panel  Addendum: Repeat lipid panel shows increase in LDL from 128 a year ago to 141. Patient's ASCVD score is 8.5%.  Discussed results with patient and her husband and informed them of the plan to initiate medium intensity statin to reduce her ASCVD risk. I also counseled them on lifestyle modifications including eating more whole grains, fish, vegetables, nuts, fruits and reducing intake of processed foods, fried foods and red meat. -Start Crestor 10 mg daily -Repeat lipid panel in 6-12 months

## 2022-12-18 NOTE — Assessment & Plan Note (Signed)
Patient reports she she was unable to get her mammogram last year because she could not find the office.  She has not had any breast cancer screening. -Referral placed for mammogram

## 2022-12-18 NOTE — Assessment & Plan Note (Addendum)
Patient reports aching left knee for the last few months.  She reports pain in her left knee on walking and afterwards being on her feet for too long.  She has no left knee pain at rest and denies any recent trauma to her knee.  The pain has not interfere with her ADLs or limited her mobility. On exam, she has normal range of motion of the left knee, there is no tenderness to palpation of the knee and there is no crepitus or effusion. 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  Plan: -Voltaren 1% gel, apply 4 times daily to left knee -Tylenol 500 mg every 6 hours as needed for pain -Advised to follow-up if pain does not improve after a few weeks or worsens.

## 2022-12-18 NOTE — Assessment & Plan Note (Signed)
Patient presents today for evaluation of watery eyes worse in her right eye.  She reports associated itchy eyes but denies any eye pain or change in her vision.  She started having these symptoms last few weeks when she goes outside.  On exam, patient has mild cataracts bilaterally but no pain with extraocular movements of the eye, no eye crusting and PERRLA. I advised patient to try over-the-counter antihistamine eyedrops. -OTC antihistamine eyedrop such as Pataday, Systane Zaditor, Visine, etc

## 2022-12-19 LAB — LIPID PANEL
Chol/HDL Ratio: 3.5 ratio (ref 0.0–4.4)
Cholesterol, Total: 229 mg/dL — ABNORMAL HIGH (ref 100–199)
HDL: 66 mg/dL (ref >39)
LDL Chol Calc (NIH): 141 mg/dL — ABNORMAL HIGH (ref 0–99)
Triglycerides: 123 mg/dL (ref 0–149)
VLDL Cholesterol Cal: 22 mg/dL (ref 5–40)

## 2022-12-19 LAB — HCV AB W REFLEX TO QUANT PCR: HCV Ab: NONREACTIVE

## 2022-12-19 LAB — BMP8+ANION GAP
Anion Gap: 16 mmol/L (ref 10.0–18.0)
BUN/Creatinine Ratio: 30 — ABNORMAL HIGH (ref 12–28)
BUN: 26 mg/dL (ref 8–27)
CO2: 24 mmol/L (ref 20–29)
Calcium: 9.7 mg/dL (ref 8.7–10.3)
Chloride: 101 mmol/L (ref 96–106)
Creatinine, Ser: 0.86 mg/dL (ref 0.57–1.00)
Glucose: 97 mg/dL (ref 70–99)
Potassium: 4.8 mmol/L (ref 3.5–5.2)
Sodium: 141 mmol/L (ref 134–144)
eGFR: 74 mL/min/{1.73_m2} (ref >59)

## 2022-12-19 LAB — HCV INTERPRETATION

## 2022-12-21 ENCOUNTER — Other Ambulatory Visit (HOSPITAL_COMMUNITY): Payer: Self-pay

## 2022-12-21 DIAGNOSIS — Z0000 Encounter for general adult medical examination without abnormal findings: Secondary | ICD-10-CM | POA: Insufficient documentation

## 2022-12-21 MED ORDER — ROSUVASTATIN CALCIUM 10 MG PO TABS
10.0000 mg | ORAL_TABLET | Freq: Every day | ORAL | 3 refills | Status: DC
  Filled 2022-12-21: qty 30, 30d supply, fill #0
  Filled 2023-01-25: qty 30, 30d supply, fill #1
  Filled 2023-03-15 (×2): qty 30, 30d supply, fill #2
  Filled 2023-04-19: qty 30, 30d supply, fill #3
  Filled 2023-05-31: qty 30, 30d supply, fill #4
  Filled 2023-07-09: qty 30, 30d supply, fill #5
  Filled 2023-08-09: qty 30, 30d supply, fill #6
  Filled 2023-09-20: qty 30, 30d supply, fill #7
  Filled 2023-10-25: qty 30, 30d supply, fill #8
  Filled 2023-11-24: qty 30, 30d supply, fill #9

## 2022-12-21 NOTE — Progress Notes (Signed)
Her labs show normal kidney function and elevated LDL to 141. Discussed results with her and her husband and initiated a moderate intensity statin with Crestor 10 mg. Negative hepatitis C screening.

## 2022-12-21 NOTE — Progress Notes (Signed)
Internal Medicine Clinic Attending  Case discussed with Dr. Amponsah  At the time of the visit.  We reviewed the resident's history and exam and pertinent patient test results.  I agree with the assessment, diagnosis, and plan of care documented in the resident's note.  

## 2022-12-21 NOTE — Addendum Note (Signed)
Addended bySharrell Ku on: 12/21/2022 05:28 PM   Modules accepted: Orders

## 2022-12-21 NOTE — Assessment & Plan Note (Signed)
Negative hepatitis C screening

## 2022-12-28 ENCOUNTER — Other Ambulatory Visit (HOSPITAL_COMMUNITY): Payer: Self-pay

## 2023-01-25 ENCOUNTER — Other Ambulatory Visit (HOSPITAL_COMMUNITY): Payer: Self-pay

## 2023-01-28 ENCOUNTER — Other Ambulatory Visit (HOSPITAL_COMMUNITY): Payer: Self-pay

## 2023-02-02 ENCOUNTER — Other Ambulatory Visit (HOSPITAL_COMMUNITY): Payer: Self-pay

## 2023-03-15 ENCOUNTER — Other Ambulatory Visit (HOSPITAL_COMMUNITY): Payer: Self-pay

## 2023-03-16 ENCOUNTER — Other Ambulatory Visit (HOSPITAL_COMMUNITY): Payer: Self-pay

## 2023-03-16 ENCOUNTER — Other Ambulatory Visit: Payer: Self-pay

## 2023-04-19 ENCOUNTER — Other Ambulatory Visit (HOSPITAL_COMMUNITY): Payer: Self-pay

## 2023-05-31 ENCOUNTER — Other Ambulatory Visit (HOSPITAL_COMMUNITY): Payer: Self-pay

## 2023-06-21 ENCOUNTER — Other Ambulatory Visit (HOSPITAL_COMMUNITY): Payer: Self-pay

## 2023-07-09 ENCOUNTER — Other Ambulatory Visit (HOSPITAL_COMMUNITY): Payer: Self-pay

## 2023-08-06 ENCOUNTER — Other Ambulatory Visit (HOSPITAL_COMMUNITY): Payer: Self-pay

## 2023-08-09 ENCOUNTER — Other Ambulatory Visit (HOSPITAL_COMMUNITY): Payer: Self-pay

## 2023-09-20 ENCOUNTER — Other Ambulatory Visit (HOSPITAL_COMMUNITY): Payer: Self-pay

## 2023-10-01 ENCOUNTER — Ambulatory Visit (INDEPENDENT_AMBULATORY_CARE_PROVIDER_SITE_OTHER): Payer: Medicare Other | Admitting: Student

## 2023-10-01 ENCOUNTER — Other Ambulatory Visit (HOSPITAL_COMMUNITY): Payer: Self-pay

## 2023-10-01 VITALS — BP 127/76 | HR 72 | Temp 98.1°F | Wt 169.4 lb

## 2023-10-01 DIAGNOSIS — Z9189 Other specified personal risk factors, not elsewhere classified: Secondary | ICD-10-CM | POA: Diagnosis not present

## 2023-10-01 DIAGNOSIS — I10 Essential (primary) hypertension: Secondary | ICD-10-CM | POA: Diagnosis not present

## 2023-10-01 DIAGNOSIS — Z13820 Encounter for screening for osteoporosis: Secondary | ICD-10-CM

## 2023-10-01 DIAGNOSIS — M25562 Pain in left knee: Secondary | ICD-10-CM | POA: Diagnosis not present

## 2023-10-01 DIAGNOSIS — G8929 Other chronic pain: Secondary | ICD-10-CM

## 2023-10-01 DIAGNOSIS — Z09 Encounter for follow-up examination after completed treatment for conditions other than malignant neoplasm: Secondary | ICD-10-CM

## 2023-10-01 DIAGNOSIS — E785 Hyperlipidemia, unspecified: Secondary | ICD-10-CM | POA: Diagnosis not present

## 2023-10-01 DIAGNOSIS — H1013 Acute atopic conjunctivitis, bilateral: Secondary | ICD-10-CM | POA: Diagnosis not present

## 2023-10-01 DIAGNOSIS — Z1231 Encounter for screening mammogram for malignant neoplasm of breast: Secondary | ICD-10-CM

## 2023-10-01 MED ORDER — NAPROXEN 500 MG PO TABS
500.0000 mg | ORAL_TABLET | Freq: Two times a day (BID) | ORAL | 0 refills | Status: DC
  Filled 2023-10-01 (×2): qty 28, 14d supply, fill #0

## 2023-10-01 NOTE — Progress Notes (Signed)
Established Patient Office Visit  Subjective   Patient ID: Jeanne Vasquez, female    DOB: Jan 02, 1956  Age: 68 y.o. MRN: [id number]  Chief Complaint  Patient presents with   Knee Pain   Eye Problem    Cortasia Screws is a 68 y.o. who presents to the clinic for follow up of HTN and HLD. Please see problem based assessment and plan for additional details.  Patient Active Problem List   Diagnosis Date Noted   Healthcare maintenance 12/21/2022   Left knee pain 12/18/2022   Allergic conjunctivitis 12/18/2022   Impaired glucose tolerance test 01/21/2022   Hypertension 11/22/2017   Hyperlipidemia 02/13/2015   Screening mammogram, encounter for 02/12/2015      Objective:     BP 127/76 (BP Location: Right Arm, Patient Position: Sitting, Cuff Size: Small)   Pulse 72   Temp 98.1 F (36.7 C) (Oral)   Wt 169 lb 6.4 oz (76.8 kg)   BMI 33.08 kg/m  BP Readings from Last 3 Encounters:  10/01/23 127/76  12/18/22 123/76  01/21/22 135/87   Wt Readings from Last 3 Encounters:  10/01/23 169 lb 6.4 oz (76.8 kg)  12/18/22 158 lb 14.4 oz (72.1 kg)  01/21/22 166 lb 11.2 oz (75.6 kg)      Physical Exam Vitals reviewed.  Constitutional:      General: She is not in acute distress.    Appearance: She is not ill-appearing, toxic-appearing or diaphoretic.  Eyes:     Extraocular Movements: Extraocular movements intact.     Conjunctiva/sclera:     Right eye: Right conjunctiva is injected.     Left eye: Left conjunctiva is injected.     Pupils: Pupils are equal, round, and reactive to light.  Cardiovascular:     Rate and Rhythm: Normal rate and regular rhythm.  Pulmonary:     Effort: Pulmonary effort is normal. No respiratory distress.     Breath sounds: No wheezing.  Musculoskeletal:     Right knee:     Instability Tests: Anterior drawer test negative. Medial McMurray test negative and lateral McMurray test negative.     Left knee: Crepitus present. Tenderness present over the lateral joint line.      Instability Tests: Anterior drawer test negative. Lateral McMurray test positive. Medial McMurray test negative.     Comments: Positive Thessaly test on left knee  Skin:    General: Skin is warm and dry.  Neurological:     Mental Status: She is alert.      No results found for any visits on 10/01/23.  Last metabolic panel Lab Results  Component Value Date   GLUCOSE 97 12/18/2022   NA 141 12/18/2022   K 4.8 12/18/2022   CL 101 12/18/2022   CO2 24 12/18/2022   BUN 26 12/18/2022   CREATININE 0.86 12/18/2022   EGFR 74 12/18/2022   CALCIUM 9.7 12/18/2022   PROT 7.4 11/22/2017   ALBUMIN 4.0 11/22/2017   LABGLOB 3.4 11/22/2017   AGRATIO 1.2 11/22/2017   BILITOT 0.2 11/22/2017   ALKPHOS 81 11/22/2017   AST 17 11/22/2017   ALT 13 11/22/2017   Last lipids Lab Results  Component Value Date   CHOL 229 (H) 12/18/2022   HDL 66 12/18/2022   LDLCALC 141 (H) 12/18/2022   TRIG 123 12/18/2022   CHOLHDL 3.5 12/18/2022   Last hemoglobin A1c Lab Results  Component Value Date   HGBA1C 5.6 01/21/2022      The 10-year ASCVD risk score (Arnett  DK, et al., 2019) is: 10.1%    Assessment & Plan:   Problem List Items Addressed This Visit       Cardiovascular and Mediastinum   Hypertension   Patient presents to clinic for follow-up of hypertension.  It appears that her hypertension is well-controlled with a blood pressure today 127/76 on a regimen of lisinopril 20 mg and amlodipine 5 mg.  Patient denies any hypotension symptoms. Plan -Continue current regimen of lisinopril 20 mg  and amlodipine 5 mg -BMP ordered for today      Relevant Orders   BMP8+Anion Gap     Other   Screening mammogram, encounter for   Relevant Orders   MM 3D SCREENING MAMMOGRAM BILATERAL BREAST   Hyperlipidemia   Patient has a history of hyperlipidemia with a prior lipid profile in April 2024 with an LDL of 141.  Patient was started on Crestor 10 mg.  Patient reports compliance with this  medication. Plan -Continue Crestor 10 mg -Repeat lipid profile today      Relevant Orders   Lipid Profile   Left knee pain - Primary   Patient was evaluated in April for left knee pain.  There is not any recent imaging of her left knee.  She stated that her knee hurts with changing of positions such as standing/getting up.  She stated that he feels "stiff" in the morning but this gets better throughout the day.  Physical exam was remarkable for signs of lateral meniscal injury as well as crepitus.  I do suspect that the patient has possible underlying OA of the left knee and/or left meniscal injury. Plan: -X-ray of the left knee ordered -Will provide patient a short course of naproxen 500 mg twice daily for 2 weeks      Relevant Orders   DG Knee Complete 4 Views Left   Allergic conjunctivitis   Patient reports continuing symptoms of allergic conjunctivitis.  She has been trying eyedrops but does not remember the name of the eyedrops.  She is still experiencing redness of the right eye greater than left eye is accompanied with watering.  This is not a daily complaint.  She is unable to identify any allergens at this time.  Physical exam does reveal some injected conjunctive of the left and right eye.  There is no inflammation or erythema of the skin surrounding the eye.  EOM intact and PERRL.  Plan: -Patient encouraged to continue over-the-counter eyedrops and to try oral allergy medication such as Allegra.      Other Visit Diagnoses       Screening for osteoporosis         At risk for osteoporosis       Relevant Orders   DG Bone Density     Encounter for follow-up examination after completed treatment for conditions other than malignant neoplasm       Relevant Orders   DG Bone Density       Return in about 3 months (around 12/30/2023) for BP, L knee pain.    Faith Rogue, DO

## 2023-10-01 NOTE — Assessment & Plan Note (Signed)
Patient reports continuing symptoms of allergic conjunctivitis.  She has been trying eyedrops but does not remember the name of the eyedrops.  She is still experiencing redness of the right eye greater than left eye is accompanied with watering.  This is not a daily complaint.  She is unable to identify any allergens at this time.  Physical exam does reveal some injected conjunctive of the left and right eye.  There is no inflammation or erythema of the skin surrounding the eye.  EOM intact and PERRL.  Plan: -Patient encouraged to continue over-the-counter eyedrops and to try oral allergy medication such as Allegra.

## 2023-10-01 NOTE — Assessment & Plan Note (Signed)
Patient presents to clinic for follow-up of hypertension.  It appears that her hypertension is well-controlled with a blood pressure today 127/76 on a regimen of lisinopril 20 mg and amlodipine 5 mg.  Patient denies any hypotension symptoms. Plan -Continue current regimen of lisinopril 20 mg  and amlodipine 5 mg -BMP ordered for today

## 2023-10-01 NOTE — Assessment & Plan Note (Signed)
Patient was evaluated in April for left knee pain.  There is not any recent imaging of her left knee.  She stated that her knee hurts with changing of positions such as standing/getting up.  She stated that he feels "stiff" in the morning but this gets better throughout the day.  Physical exam was remarkable for signs of lateral meniscal injury as well as crepitus.  I do suspect that the patient has possible underlying OA of the left knee and/or left meniscal injury. Plan: -X-ray of the left knee ordered -Will provide patient a short course of naproxen 500 mg twice daily for 2 weeks

## 2023-10-01 NOTE — Patient Instructions (Addendum)
Thank you, [name] for allowing [place] to provide your care today. Today we discussed blood pressure, high lipids, and allergic conjunctivitis.    I have ordered the following labs for you:  Lab Orders  No laboratory test(s) ordered today     I have ordered the following medication/changed the following medications:   Stop the following medications: There are no discontinued medications.   Start the following medications: Meds ordered this encounter  Medications   naproxen (NAPROSYN) 500 MG tablet    Sig: Take 1 tablet (500 mg total) by mouth 2 (two) times daily with a meal.    Dispense:  28 tablet    Refill:  0     Follow up: 3 months      Remember: Purchase allegra over the counter to help with the eye watering and itching.   Should you have any questions or concerns please call the internal medicine clinic at [phone].     Please note that our late policy has changed.  If you are more than 15 minutes late to your appointment, you may be asked to reschedule your appointment.  Dr. Hessie Diener, D.O. [facility]   ??????????????? Jeanne Vasquez ?????????????????????????????????????????????????????? ??????? ???????????????????????????? ??????????????? ????????????????????    ?????????????????????????????????????????????????  ???????????????????????? ?????????????????????????????????????????????????????    ?????????????????????????????/??????????????????????   ??????????????????????? ????????????????????????   ?????????????????????????? Meds ?????????????????? ?????  ??????????? naproxen (NAPROSYN) 500 MG   Sig: ??? 1 ?????? (???? 500 mg) ??????? 2 (?????) ???????????????????????   ??????? ?? ??????   ?????: 0    ??????? ? ??    ?????? ??? allegra ???????? ??????????????????????????? ?????????   ????????????????????? ???????????? ????????????????????????????????????? [phone]?     ????????????????????????????????????????????????   ?????????????????? 15 ????????????????????????? ????????????????????????????????????????????????????????  ??????????? Jeanne Vasquez, D.O. saum arkoun lokasrei Jeanne Vasquez  del ban anounhnhat aoy puok yeung phtal karthetam robsa anak now [place] . thngainih  yeung ban pipheaksaea ampi chomnguleusachheam  cheate khlanh khpasa  ning chomngurleakasraomkhuor .     khnhom ban banhchea aoy montirpisaoth khangokraom samreab anak :   kar banhcheatinh montirpisaoth  kmean kar thveutest montirpisaoth ban banhcheatinh now thngainih te .     khnhom ban banhcheatinh thnam khangokraom/ btau r thnam dauch khang krao m    banhchhob thnam dauch khang krao m  min mean thnam del chhb brae te .    chabphtaem thnam dauch khang krao m Meds  ban banhchea aoy chuob nih . thnam   kreab thnam naproxen (NAPROSYN) 500 MG   Sig: leb 1 kreab (saroub 500 mg)  tam meat 2 ( pir dng)  knong muoyothngai cheamuoy ahar .    chekchay :  28 kreab    bampenh: 0     tamdan : 3 khe     changcham : tinh allegra  tam banhchor  daembi chuoy dl kar sravang phnek  ning rmeasa .    brasenbae anak mean chamngol  ryy kangvol namuoy  saum toursapt tow kli ni k aosath khangoknong tam lekh [phone] .      saum chamnam tha kolonyobeay yutyeav robsa yeung ban phlasa btau r .   brasenbae anak yut cheang 15  neati dl karnatchuob robsa anak  anak ach nung trauv ban snaesom aoy riebcham kalvipheak robsa anak laengvinh .  vechchobandet Jeanne Vasquez, D.O.

## 2023-10-01 NOTE — Assessment & Plan Note (Addendum)
Patient has a history of hyperlipidemia with a prior lipid profile in April 2024 with an LDL of 141.  Patient was started on Crestor 10 mg.  Patient reports compliance with this medication. Plan -Continue Crestor 10 mg -Repeat lipid profile today

## 2023-10-02 LAB — BMP8+ANION GAP
Anion Gap: 14 mmol/L (ref 10.0–18.0)
BUN/Creatinine Ratio: 28 (ref 12–28)
BUN: 22 mg/dL (ref 8–27)
CO2: 24 mmol/L (ref 20–29)
Calcium: 9.4 mg/dL (ref 8.7–10.3)
Chloride: 105 mmol/L (ref 96–106)
Creatinine, Ser: 0.79 mg/dL (ref 0.57–1.00)
Glucose: 83 mg/dL (ref 70–99)
Potassium: 4.2 mmol/L (ref 3.5–5.2)
Sodium: 143 mmol/L (ref 134–144)
eGFR: 81 mL/min/{1.73_m2} (ref >59)

## 2023-10-02 LAB — LIPID PANEL
Chol/HDL Ratio: 2.1 {ratio} (ref 0.0–4.4)
Cholesterol, Total: 159 mg/dL (ref 100–199)
HDL: 76 mg/dL (ref >39)
LDL Chol Calc (NIH): 68 mg/dL (ref 0–99)
Triglycerides: 77 mg/dL (ref 0–149)
VLDL Cholesterol Cal: 15 mg/dL (ref 5–40)

## 2023-10-04 NOTE — Progress Notes (Signed)
Internal Medicine Clinic Attending  Case discussed with the resident at the time of the visit.  We reviewed the resident's history and exam and pertinent patient test results.  I agree with the assessment, diagnosis, and plan of care documented in the resident's note.

## 2023-10-05 ENCOUNTER — Encounter: Payer: Self-pay | Admitting: Student

## 2023-10-11 ENCOUNTER — Encounter: Payer: Self-pay | Admitting: Student

## 2023-10-25 ENCOUNTER — Other Ambulatory Visit (HOSPITAL_COMMUNITY): Payer: Self-pay

## 2023-11-24 ENCOUNTER — Other Ambulatory Visit (HOSPITAL_COMMUNITY): Payer: Self-pay

## 2023-11-24 ENCOUNTER — Other Ambulatory Visit: Payer: Self-pay

## 2023-12-28 ENCOUNTER — Other Ambulatory Visit: Payer: Self-pay | Admitting: Student

## 2023-12-28 ENCOUNTER — Other Ambulatory Visit (HOSPITAL_COMMUNITY): Payer: Self-pay

## 2023-12-28 ENCOUNTER — Other Ambulatory Visit: Payer: Self-pay

## 2023-12-28 DIAGNOSIS — I10 Essential (primary) hypertension: Secondary | ICD-10-CM

## 2023-12-28 MED ORDER — ROSUVASTATIN CALCIUM 10 MG PO TABS
10.0000 mg | ORAL_TABLET | Freq: Every day | ORAL | 3 refills | Status: DC
  Filled 2023-12-28: qty 90, 90d supply, fill #0
  Filled 2024-01-13: qty 30, 30d supply, fill #0
  Filled 2024-03-13: qty 30, 30d supply, fill #1
  Filled 2024-05-01 – 2024-05-11 (×2): qty 30, 30d supply, fill #2
  Filled 2024-06-26 (×2): qty 30, 30d supply, fill #3
  Filled 2024-07-31: qty 30, 30d supply, fill #4
  Filled 2024-09-05: qty 30, 30d supply, fill #5

## 2023-12-28 MED ORDER — LISINOPRIL 20 MG PO TABS
20.0000 mg | ORAL_TABLET | Freq: Every day | ORAL | 3 refills | Status: DC
  Filled 2023-12-28: qty 90, 90d supply, fill #0
  Filled 2024-01-13: qty 30, 30d supply, fill #0
  Filled 2024-03-13: qty 30, 30d supply, fill #1
  Filled 2024-05-01 – 2024-05-11 (×2): qty 30, 30d supply, fill #2
  Filled 2024-06-26 (×2): qty 30, 30d supply, fill #3
  Filled 2024-07-31: qty 30, 30d supply, fill #4
  Filled 2024-09-05: qty 30, 30d supply, fill #5

## 2023-12-28 MED ORDER — AMLODIPINE BESYLATE 5 MG PO TABS
5.0000 mg | ORAL_TABLET | Freq: Every day | ORAL | 3 refills | Status: DC
  Filled 2023-12-28: qty 90, 90d supply, fill #0
  Filled 2024-01-13: qty 30, 30d supply, fill #0
  Filled 2024-03-13: qty 30, 30d supply, fill #1
  Filled 2024-05-01 – 2024-05-11 (×2): qty 30, 30d supply, fill #2
  Filled 2024-06-26 (×2): qty 30, 30d supply, fill #3
  Filled 2024-07-31: qty 30, 30d supply, fill #4
  Filled 2024-09-05: qty 30, 30d supply, fill #5

## 2023-12-28 NOTE — Telephone Encounter (Signed)
 Medication sent to pharmacy

## 2023-12-29 ENCOUNTER — Other Ambulatory Visit (HOSPITAL_COMMUNITY): Payer: Self-pay

## 2023-12-29 ENCOUNTER — Encounter (HOSPITAL_COMMUNITY): Payer: Self-pay

## 2024-01-05 ENCOUNTER — Other Ambulatory Visit: Payer: Self-pay | Admitting: Student

## 2024-01-05 NOTE — Telephone Encounter (Signed)
 Copied from CRM [id number]. Topic: Clinical - Medication Refill >> Jan 05, 2024  1:52 PM Maryln Sober wrote: Most Recent Primary Care Visit:  Provider: Aurora Lees  Department: [facility]  Visit Type: OPEN ESTABLISHED  Date: 10/01/2023  Medication:  naproxen  (NAPROSYN ) 500 MG tablet  Has the patient contacted their pharmacy? No  Is this the correct pharmacy for this prescription? Yes If no, delete pharmacy and type the correct one.  This is the patient's preferred pharmacy:  [facility] [address] [place] [address] Phone: [phone] Fax: [phone]  [place] [facility] [address] [place] [address] Phone: [phone] Fax: [phone]   Has the prescription been filled recently? No  Is the patient out of the medication? Yes  Has the patient been seen for an appointment in the last year OR does the patient have an upcoming appointment? No  Can we respond through MyChart? Yes  Agent: Please be advised that Rx refills may take up to 3 business days. We ask that you follow-up with your pharmacy.

## 2024-01-06 ENCOUNTER — Other Ambulatory Visit (HOSPITAL_COMMUNITY): Payer: Self-pay

## 2024-01-06 NOTE — Telephone Encounter (Signed)
 Last Fill: 10/01/23 28 tabs/0 RF  Last OV: 10/01/23 Next OV: None Scheduled  Routing to provider for review/authorization.

## 2024-01-07 ENCOUNTER — Other Ambulatory Visit (HOSPITAL_COMMUNITY): Payer: Self-pay

## 2024-01-10 ENCOUNTER — Other Ambulatory Visit (HOSPITAL_COMMUNITY): Payer: Self-pay

## 2024-01-13 ENCOUNTER — Other Ambulatory Visit (HOSPITAL_COMMUNITY): Payer: Self-pay

## 2024-03-13 ENCOUNTER — Other Ambulatory Visit (HOSPITAL_COMMUNITY): Payer: Self-pay

## 2024-03-14 ENCOUNTER — Other Ambulatory Visit: Payer: Self-pay | Admitting: Student

## 2024-03-14 ENCOUNTER — Other Ambulatory Visit (HOSPITAL_COMMUNITY): Payer: Self-pay

## 2024-03-15 ENCOUNTER — Other Ambulatory Visit (HOSPITAL_COMMUNITY): Payer: Self-pay

## 2024-03-15 ENCOUNTER — Encounter (HOSPITAL_COMMUNITY): Payer: Self-pay

## 2024-03-20 ENCOUNTER — Other Ambulatory Visit (HOSPITAL_COMMUNITY): Payer: Self-pay

## 2024-05-01 ENCOUNTER — Other Ambulatory Visit: Payer: Self-pay | Admitting: Student

## 2024-05-01 ENCOUNTER — Other Ambulatory Visit (HOSPITAL_COMMUNITY): Payer: Self-pay

## 2024-05-02 ENCOUNTER — Encounter (HOSPITAL_COMMUNITY): Payer: Self-pay

## 2024-05-02 ENCOUNTER — Other Ambulatory Visit (HOSPITAL_COMMUNITY): Payer: Self-pay

## 2024-05-10 ENCOUNTER — Other Ambulatory Visit (HOSPITAL_COMMUNITY): Payer: Self-pay

## 2024-05-11 ENCOUNTER — Other Ambulatory Visit (HOSPITAL_COMMUNITY): Payer: Self-pay

## 2024-06-26 ENCOUNTER — Other Ambulatory Visit (HOSPITAL_COMMUNITY): Payer: Self-pay

## 2024-07-31 ENCOUNTER — Other Ambulatory Visit (HOSPITAL_COMMUNITY): Payer: Self-pay

## 2024-09-05 ENCOUNTER — Other Ambulatory Visit (HOSPITAL_COMMUNITY): Payer: Self-pay

## 2024-09-25 ENCOUNTER — Other Ambulatory Visit (HOSPITAL_COMMUNITY): Payer: Self-pay

## 2024-09-25 ENCOUNTER — Ambulatory Visit: Payer: Self-pay | Admitting: Student

## 2024-09-25 VITALS — BP 131/85 | HR 75 | Temp 98.0°F | Ht 60.0 in | Wt 169.2 lb

## 2024-09-25 DIAGNOSIS — M722 Plantar fascial fibromatosis: Secondary | ICD-10-CM | POA: Diagnosis not present

## 2024-09-25 DIAGNOSIS — Z13820 Encounter for screening for osteoporosis: Secondary | ICD-10-CM

## 2024-09-25 DIAGNOSIS — Z9189 Other specified personal risk factors, not elsewhere classified: Secondary | ICD-10-CM

## 2024-09-25 DIAGNOSIS — E785 Hyperlipidemia, unspecified: Secondary | ICD-10-CM | POA: Diagnosis not present

## 2024-09-25 DIAGNOSIS — I10 Essential (primary) hypertension: Secondary | ICD-10-CM | POA: Diagnosis not present

## 2024-09-25 DIAGNOSIS — Z1231 Encounter for screening mammogram for malignant neoplasm of breast: Secondary | ICD-10-CM | POA: Diagnosis not present

## 2024-09-25 DIAGNOSIS — Z09 Encounter for follow-up examination after completed treatment for conditions other than malignant neoplasm: Secondary | ICD-10-CM

## 2024-09-25 MED ORDER — AMLODIPINE BESYLATE 5 MG PO TABS
5.0000 mg | ORAL_TABLET | Freq: Every day | ORAL | 3 refills | Status: AC
  Filled 2024-09-25: qty 30, 30d supply, fill #0

## 2024-09-25 MED ORDER — LISINOPRIL 20 MG PO TABS
20.0000 mg | ORAL_TABLET | Freq: Every day | ORAL | 3 refills | Status: AC
  Filled 2024-09-25: qty 30, 30d supply, fill #0

## 2024-09-25 MED ORDER — DICLOFENAC SODIUM 1 % EX GEL
2.0000 g | Freq: Four times a day (QID) | CUTANEOUS | 1 refills | Status: AC
  Filled 2024-09-25: qty 150, fill #0

## 2024-09-25 MED ORDER — ROSUVASTATIN CALCIUM 10 MG PO TABS
10.0000 mg | ORAL_TABLET | Freq: Every day | ORAL | 3 refills | Status: AC
  Filled 2024-09-25: qty 30, 30d supply, fill #0

## 2024-09-25 MED ORDER — NAPROXEN 500 MG PO TABS
500.0000 mg | ORAL_TABLET | Freq: Two times a day (BID) | ORAL | 0 refills | Status: AC
  Filled 2024-09-25: qty 14, 7d supply, fill #0

## 2024-09-25 NOTE — Patient Instructions (Signed)
 Thank you, Jeanne Vasquez for allowing us  to provide your care today. Today we discussed blood pressure and left heel pain.  For the left heel pain: -Take naproxen  500 mg twice a day for pain -You may use tylenol  too -Please try the stretches we printed out -Rest the foot -Try to freeze a water bottle and roll your foot over this -If you are still having pain: please let us  know and we can send you to a foot doctor    I have ordered the following labs for you:   Lab Orders         Basic metabolic panel with GFR         Lipid Profile       I have ordered the following medication/changed the following medications:   Stop the following medications: Medications Discontinued During This Encounter  Medication Reason   amLODipine  (NORVASC ) 5 MG tablet Reorder   lisinopril  (ZESTRIL ) 20 MG tablet Reorder   rosuvastatin  (CRESTOR ) 10 MG tablet Reorder     Start the following medications: Meds ordered this encounter  Medications   amLODipine  (NORVASC ) 5 MG tablet    Sig: Take 1 tablet (5 mg total) by mouth daily.    Dispense:  90 tablet    Refill:  3    IM program   rosuvastatin  (CRESTOR ) 10 MG tablet    Sig: Take 1 tablet (10 mg total) by mouth daily.    Dispense:  90 tablet    Refill:  3    IM program   lisinopril  (ZESTRIL ) 20 MG tablet    Sig: Take 1 tablet (20 mg total) by mouth daily.    Dispense:  90 tablet    Refill:  3    IM program     Follow up: 3 months or sooner   Remember:   Should you have any questions or concerns please call the internal medicine clinic at [phone].     Please note that our late policy has changed.  If you are more than 15 minutes late to your appointment, you may be asked to reschedule your appointment.  Dr. Kandis, D.O. [facility]

## 2024-09-25 NOTE — Assessment & Plan Note (Signed)
 Patient presents with a history of hypertension with a blood pressure today of 145/84, rechecked at 131/85. Their hypertension is controlled on a regimen of amlodipine  5mg  and lisinopril  20 mg.  Prior BMP in January 2025, Scr was WNL.   Plan: -Continue current regimen of: Amlodipine  5mg  and lisinopril  20 mg -BMP today

## 2024-09-25 NOTE — Assessment & Plan Note (Signed)
 On crestor  10 mg. Last LDL was 68 one year prior.  Lipid profile ordered

## 2024-09-25 NOTE — Progress Notes (Unsigned)
 "  Established Patient Office Visit  Subjective   Patient ID: Jeanne Vasquez, female    DOB: 1955-10-28  Age: 69 y.o. MRN: [id number]  Chief Complaint  Patient presents with   Medical Management of Chronic Issues   Foot Pain    Left heel pain     Caliann Leckrone is a 69 y.o. who presents to the clinic for HTN. Please see problem based assessment and plan for additional details.    Patient Active Problem List   Diagnosis Date Noted   Plantar fasciitis 09/26/2024   Healthcare maintenance 12/21/2022   Impaired glucose tolerance test 01/21/2022   Hypertension 11/22/2017   Hyperlipidemia 02/13/2015   Screening mammogram, encounter for 02/12/2015      Objective:     BP 131/85 (BP Location: Left Arm, Patient Position: Sitting, Cuff Size: Normal)   Pulse 75   Temp 98 F (36.7 C) (Oral)   Ht 5' (1.524 m)   Wt 169 lb 3.2 oz (76.7 kg)   SpO2 97%   BMI 33.04 kg/m  BP Readings from Last 3 Encounters:  09/25/24 131/85  10/01/23 127/76  12/18/22 123/76   Wt Readings from Last 3 Encounters:  09/25/24 169 lb 3.2 oz (76.7 kg)  10/01/23 169 lb 6.4 oz (76.8 kg)  12/18/22 158 lb 14.4 oz (72.1 kg)      Physical Exam Vitals reviewed.  Constitutional:      General: She is not in acute distress.    Appearance: She is not ill-appearing, toxic-appearing or diaphoretic.  Cardiovascular:     Rate and Rhythm: Normal rate and regular rhythm.  Pulmonary:     Effort: Pulmonary effort is normal. No respiratory distress.     Breath sounds: Normal breath sounds. No wheezing.  Musculoskeletal:     Right lower leg: No edema.     Left lower leg: No edema.     Comments: Left heel discomfort while palpating the calcaneal bone, and tenderness tracking along the plantar fascia.  No obvious deformity or edema of the left foot  Skin:    General: Skin is warm and dry.  Neurological:     Mental Status: She is alert.      Results for orders placed or performed in visit on 09/25/24  Basic metabolic panel  with GFR  Result Value Ref Range   Glucose 87 70 - 99 mg/dL   BUN 22 8 - 27 mg/dL   Creatinine, Ser 9.09 0.57 - 1.00 mg/dL   eGFR 69 >40 fO/fpw/8.26   BUN/Creatinine Ratio 24 12 - 28   Sodium 139 134 - 144 mmol/L   Potassium 4.4 3.5 - 5.2 mmol/L   Chloride 101 96 - 106 mmol/L   CO2 23 20 - 29 mmol/L   Calcium  9.7 8.7 - 10.3 mg/dL  Lipid Profile  Result Value Ref Range   Cholesterol, Total 158 100 - 199 mg/dL   Triglycerides 871 0 - 149 mg/dL   HDL 72 >60 mg/dL   VLDL Cholesterol Cal 22 5 - 40 mg/dL   LDL Chol Calc (NIH) 64 0 - 99 mg/dL   Chol/HDL Ratio 2.2 0.0 - 4.4 ratio    Last metabolic panel Lab Results  Component Value Date   GLUCOSE 87 09/25/2024   NA 139 09/25/2024   K 4.4 09/25/2024   CL 101 09/25/2024   CO2 23 09/25/2024   BUN 22 09/25/2024   CREATININE 0.90 09/25/2024   EGFR 69 09/25/2024   CALCIUM  9.7 09/25/2024   PROT 7.4  11/22/2017   ALBUMIN 4.0 11/22/2017   LABGLOB 3.4 11/22/2017   AGRATIO 1.2 11/22/2017   BILITOT 0.2 11/22/2017   ALKPHOS 81 11/22/2017   AST 17 11/22/2017   ALT 13 11/22/2017      The 10-year ASCVD risk score (Arnett DK, et al., 2019) is: 10.3%    Assessment & Plan:   Problem List Items Addressed This Visit       Cardiovascular and Mediastinum   Hypertension   Patient presents with a history of hypertension with a blood pressure today of 145/84, rechecked at 131/85. Their hypertension is controlled on a regimen of amlodipine  5mg  and lisinopril  20 mg.  Prior BMP in January 2025, Scr was WNL.   Plan: -Continue current regimen of: Amlodipine  5mg  and lisinopril  20 mg -BMP today       Relevant Medications   amLODipine  (NORVASC ) 5 MG tablet   rosuvastatin  (CRESTOR ) 10 MG tablet   lisinopril  (ZESTRIL ) 20 MG tablet   Other Relevant Orders   Basic metabolic panel with GFR (Completed)     Musculoskeletal and Integument   Plantar fasciitis   Patient presents with a history of left heel pain that started 1 month ago.  She  reported that she was standing on a small platform placing a lot of pressure on her left foot and after words that is when the pain started.  She denied any recent falls, trauma, increase in exercise, other injuries to this foot.  She has not tried any over-the-counter medications to help with the pain.  Physical exam reveals tenderness along the calcaneal bone tracking along the plantar fascia.  Of note patient is wearing socks with flip-flops, apparently she wears flip-flops daily. Suspect plantar fasciitis.  Plan: -Provided patient with exercises/stretches -1 week course of naproxen , patient encouraged to use over-the-counter Voltaren  gel and Tylenol  -Encourage patient to rest ice and elevate left foot -Patient was instructed to wear more supportive shoes -If her pain does not improve with conservative measures, will refer to podiatry        Other   Hyperlipidemia - Primary   On crestor  10 mg. Last LDL was 68 one year prior.  Lipid profile ordered      Relevant Medications   amLODipine  (NORVASC ) 5 MG tablet   rosuvastatin  (CRESTOR ) 10 MG tablet   lisinopril  (ZESTRIL ) 20 MG tablet   Other Relevant Orders   Lipid Profile (Completed)   Screening mammogram, encounter for   Relevant Orders   MM 3D SCREENING MAMMOGRAM BILATERAL BREAST   Other Visit Diagnoses       Essential hypertension       Relevant Medications   amLODipine  (NORVASC ) 5 MG tablet   rosuvastatin  (CRESTOR ) 10 MG tablet   lisinopril  (ZESTRIL ) 20 MG tablet     Encounter for screening for osteoporosis         At risk for osteoporosis       Relevant Orders   DG Bone Density     Encounter for follow-up examination after completed treatment for conditions other than malignant neoplasm       Relevant Orders   DG Bone Density       Return in about 3 months (around 12/24/2024) for Follow-up of left foot pain.    Damien Lease, DO  "

## 2024-09-26 ENCOUNTER — Ambulatory Visit: Payer: Self-pay | Admitting: Student

## 2024-09-26 DIAGNOSIS — M722 Plantar fascial fibromatosis: Secondary | ICD-10-CM | POA: Insufficient documentation

## 2024-09-26 LAB — BASIC METABOLIC PANEL WITH GFR
BUN/Creatinine Ratio: 24 (ref 12–28)
BUN: 22 mg/dL (ref 8–27)
CO2: 23 mmol/L (ref 20–29)
Calcium: 9.7 mg/dL (ref 8.7–10.3)
Chloride: 101 mmol/L (ref 96–106)
Creatinine, Ser: 0.9 mg/dL (ref 0.57–1.00)
Glucose: 87 mg/dL (ref 70–99)
Potassium: 4.4 mmol/L (ref 3.5–5.2)
Sodium: 139 mmol/L (ref 134–144)
eGFR: 69 mL/min/1.73

## 2024-09-26 LAB — LIPID PANEL
Chol/HDL Ratio: 2.2 ratio (ref 0.0–4.4)
Cholesterol, Total: 158 mg/dL (ref 100–199)
HDL: 72 mg/dL
LDL Chol Calc (NIH): 64 mg/dL (ref 0–99)
Triglycerides: 128 mg/dL (ref 0–149)
VLDL Cholesterol Cal: 22 mg/dL (ref 5–40)

## 2024-09-26 NOTE — Assessment & Plan Note (Signed)
 Patient presents with a history of left heel pain that started 1 month ago.  She reported that she was standing on a small platform placing a lot of pressure on her left foot and after words that is when the pain started.  She denied any recent falls, trauma, increase in exercise, other injuries to this foot.  She has not tried any over-the-counter medications to help with the pain.  Physical exam reveals tenderness along the calcaneal bone tracking along the plantar fascia.  Of note patient is wearing socks with flip-flops, apparently she wears flip-flops daily. Suspect plantar fasciitis.  Plan: -Provided patient with exercises/stretches -1 week course of naproxen , patient encouraged to use over-the-counter Voltaren  gel and Tylenol  -Encourage patient to rest ice and elevate left foot -Patient was instructed to wear more supportive shoes -If her pain does not improve with conservative measures, will refer to podiatry

## 2024-09-27 NOTE — Progress Notes (Signed)
 Internal Medicine Clinic Attending  Case discussed with the resident at the time of the visit.  We reviewed the resident's history and exam and pertinent patient test results.  I agree with the assessment, diagnosis, and plan of care documented in the resident's note.

## 2024-10-23 ENCOUNTER — Ambulatory Visit

## 2024-12-25 ENCOUNTER — Ambulatory Visit: Payer: Self-pay
